# Patient Record
Sex: Female | Born: 1985 | Race: White | Hispanic: No | Marital: Married | Smoking: Never smoker
Health system: Southern US, Community
[De-identification: ages and names within clinical notes are randomized; demographics above are authoritative.]

## PROBLEM LIST (undated history)

## (undated) DIAGNOSIS — T4145XA Adverse effect of unspecified anesthetic, initial encounter: Secondary | ICD-10-CM

## (undated) DIAGNOSIS — D649 Anemia, unspecified: Secondary | ICD-10-CM

## (undated) DIAGNOSIS — K219 Gastro-esophageal reflux disease without esophagitis: Secondary | ICD-10-CM

## (undated) DIAGNOSIS — I10 Essential (primary) hypertension: Secondary | ICD-10-CM

## (undated) DIAGNOSIS — J301 Allergic rhinitis due to pollen: Secondary | ICD-10-CM

## (undated) DIAGNOSIS — K589 Irritable bowel syndrome without diarrhea: Secondary | ICD-10-CM

## (undated) DIAGNOSIS — T8859XA Other complications of anesthesia, initial encounter: Secondary | ICD-10-CM

## (undated) DIAGNOSIS — R112 Nausea with vomiting, unspecified: Secondary | ICD-10-CM

## (undated) DIAGNOSIS — Z9889 Other specified postprocedural states: Secondary | ICD-10-CM

## (undated) DIAGNOSIS — Z8619 Personal history of other infectious and parasitic diseases: Secondary | ICD-10-CM

## (undated) DIAGNOSIS — Z8489 Family history of other specified conditions: Secondary | ICD-10-CM

## (undated) HISTORY — DX: Anemia, unspecified: D64.9

## (undated) HISTORY — DX: Allergic rhinitis due to pollen: J30.1

## (undated) HISTORY — DX: Irritable bowel syndrome, unspecified: K58.9

## (undated) HISTORY — DX: Personal history of other infectious and parasitic diseases: Z86.19

## (undated) HISTORY — DX: Essential (primary) hypertension: I10

---
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
## 2006-01-20 ENCOUNTER — Encounter: Admission: RE | Admit: 2006-01-20 | Discharge: 2006-01-20 | Payer: Self-pay | Admitting: Gastroenterology

## 2006-10-04 ENCOUNTER — Ambulatory Visit (HOSPITAL_COMMUNITY): Admission: RE | Admit: 2006-10-04 | Discharge: 2006-10-04 | Payer: Self-pay | Admitting: Gastroenterology

## 2006-10-13 ENCOUNTER — Encounter: Admission: RE | Admit: 2006-10-13 | Discharge: 2006-10-13 | Payer: Self-pay | Admitting: Gastroenterology

## 2006-10-17 HISTORY — PX: CHOLECYSTECTOMY: SHX55

## 2006-11-27 ENCOUNTER — Encounter (INDEPENDENT_AMBULATORY_CARE_PROVIDER_SITE_OTHER): Payer: Self-pay | Admitting: *Deleted

## 2006-11-27 ENCOUNTER — Ambulatory Visit (HOSPITAL_COMMUNITY): Admission: RE | Admit: 2006-11-27 | Discharge: 2006-11-27 | Payer: Self-pay | Admitting: Surgery

## 2009-06-26 ENCOUNTER — Encounter: Admission: RE | Admit: 2009-06-26 | Discharge: 2009-06-26 | Payer: Self-pay | Admitting: Family Medicine

## 2010-11-08 ENCOUNTER — Encounter: Payer: Self-pay | Admitting: Family Medicine

## 2011-03-04 NOTE — Op Note (Signed)
Natasha Fernandez, Natasha Fernandez               ACCOUNT NO.:  [id number]   MEDICAL RECORD NO.:  [id number]          PATIENT TYPE:  AMB   LOCATION:  DAY                          FACILITY:  [facility]   PHYSICIAN:  Thornton Park. Daphine Deutscher, MD  DATE OF BIRTH:  April 04, 1986   DATE OF PROCEDURE:  11/27/2006  DATE OF DISCHARGE:                               OPERATIVE REPORT   PREOPERATIVE DIAGNOSIS:  Biliary dyskinesia.   POSTOPERATIVE DIAGNOSIS:  Biliary dyskinesia, normal intraoperative  cholangiogram.   PROCEDURE:  Laparoscopic cholecystectomy and intraoperative  cholangiogram.   SURGEON:  Thornton Park. Daphine Deutscher, MD   ASSISTANT:  Baruch Merl, M.D.   ANESTHESIA:  General endotracheal.   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   She was taken to room 11, given general anesthesia.  The abdomen was  prepped with Techni-Care and draped sterilely.  A longitudinal incision  was made down into her umbilicus through that I inserted a Hasson  cannula and insufflated the abdomen.  Three other trocars were placed in  the upper abdomen.  The gallbladder was grasped and elevated.  Calot's  triangle was dissected free of fat and identified the cystic artery and  cystic duct and held them out to give a critical view of this anatomy.  We went ahead and put clips on the cystic artery and a clip upon the  gallbladder side of the cystic duct and incised cystic duct to insert a  Reddick catheter and took a dynamic cholangiogram which showed good  filling of the small intrahepatic ducts and free flow into the duodenum.  The cystic duct was then triple clipped divided as was the cystic  artery.  The gallbladder was then removed from the gallbladder bed with  hook electrocautery without entering it.  The gallbladder bed was  dry  and the gallbladder was then placed in a bag and brought out through the  umbilicus.  The umbilical defect was repaired using the EndoClose device  to thread a 0-0 Vicryl and get better purchases in the lower half of the  small incision I made.  This was tied  down.  I inspected the gallbladder again, irrigated withdrew through the  area and withdrew all the trocars.  The port sites were injected with  Marcaine and closed with 4-0 Vicryl, Benzoin and Steri-Strips.  The  patient tolerated procedure well and was taken to recovery room in  satisfactory condition.      Thornton Park Daphine Deutscher, MD  Electronically Signed     MBM/MEDQ  D:  11/27/2006  T:  11/27/2006  Job:  [id number]   cc:   Petra Kuba, M.D.  Fax: [phone]

## 2011-03-21 ENCOUNTER — Ambulatory Visit (INDEPENDENT_AMBULATORY_CARE_PROVIDER_SITE_OTHER): Payer: 59 | Admitting: Family Medicine

## 2011-03-21 ENCOUNTER — Encounter: Payer: Self-pay | Admitting: Family Medicine

## 2011-03-21 DIAGNOSIS — Z0000 Encounter for general adult medical examination without abnormal findings: Secondary | ICD-10-CM | POA: Insufficient documentation

## 2011-03-21 LAB — BASIC METABOLIC PANEL
BUN: 10 mg/dL (ref 6–23)
CO2: 26 mEq/L (ref 19–32)
Calcium: 8.7 mg/dL (ref 8.4–10.5)
Chloride: 106 mEq/L (ref 96–112)
Creatinine, Ser: 0.7 mg/dL (ref 0.4–1.2)

## 2011-03-21 LAB — CBC WITH DIFFERENTIAL/PLATELET
Basophils Relative: 0.3 % (ref 0.0–3.0)
Eosinophils Absolute: 0.1 10*3/uL (ref 0.0–0.7)
HCT: 38.3 % (ref 36.0–46.0)
Hemoglobin: 12.9 g/dL (ref 12.0–15.0)
Lymphocytes Relative: 25.2 % (ref 12.0–46.0)
Lymphs Abs: 1.8 10*3/uL (ref 0.7–4.0)
MCHC: 33.8 g/dL (ref 30.0–36.0)
Neutro Abs: 4.6 10*3/uL (ref 1.4–7.7)
RBC: 4.25 Mil/uL (ref 3.87–5.11)

## 2011-03-21 LAB — HEPATIC FUNCTION PANEL
ALT: 19 U/L (ref 0–35)
Total Bilirubin: 0.5 mg/dL (ref 0.3–1.2)
Total Protein: 6.7 g/dL (ref 6.0–8.3)

## 2011-03-21 LAB — LIPID PANEL
Cholesterol: 148 mg/dL (ref 0–200)
Triglycerides: 77 mg/dL (ref 0.0–149.0)

## 2011-03-21 MED ORDER — SCOPOLAMINE 1 MG/3DAYS TD PT72
1.0000 | MEDICATED_PATCH | TRANSDERMAL | Status: AC
Start: 2011-03-21 — End: 2012-03-20

## 2011-03-21 NOTE — Assessment & Plan Note (Signed)
Pt's PE WNL.  Check labs.  Anticipatory guidance provided.  

## 2011-03-21 NOTE — Progress Notes (Signed)
  Subjective:    Patient ID: Natasha Fernandez, female    DOB: [birth year], 25 y.o.   MRN: [id number]  HPI New to establish care.  GYNSenaida Fernandez.  GI- [facility], colonoscopy 2007.  No previous PCP.  Desires CPE.   Review of Systems Patient reports no vision/ hearing changes, adenopathy,fever, weight change,  persistant/recurrent hoarseness , swallowing issues, chest pain, palpitations, edema, persistant/recurrent cough, hemoptysis, dyspnea (rest/exertional/paroxysmal nocturnal), gastrointestinal bleeding (melena, rectal bleeding), abdominal pain, significant heartburn, bowel changes, GU symptoms (dysuria, hematuria, incontinence), Gyn symptoms (abnormal  bleeding, pain),  syncope, focal weakness, memory loss, numbness & tingling, skin/hair/nail changes, abnormal bruising or bleeding, anxiety, or depression.     Objective:   Physical Exam  General Appearance:    Alert, cooperative, no distress, appears stated age  Head:    Normocephalic, without obvious abnormality, atraumatic  Eyes:    PERRL, conjunctiva/corneas clear, EOM's intact, fundi    benign, both eyes  Ears:    Normal TM's and external ear canals, both ears  Nose:   Nares normal, septum midline, mucosa normal, no drainage    or sinus tenderness  Throat:   Lips, mucosa, and tongue normal; teeth and gums normal  Neck:   Supple, symmetrical, trachea midline, no adenopathy;    Thyroid: no enlargement/tenderness/nodules  Back:     Symmetric, no curvature, ROM normal, no CVA tenderness  Lungs:     Clear to auscultation bilaterally, respirations unlabored  Chest Wall:    No tenderness or deformity   Heart:    Regular rate and rhythm, S1 and S2 normal, no murmur, rub   or gallop  Breast Exam:    Deferred to GYN  Abdomen:     Soft, non-tender, bowel sounds active all four quadrants,    no masses, no organomegaly  Genitalia:    Deferred to GYN     Extremities:   Extremities normal, atraumatic, no cyanosis or edema  Pulses:   2+ and  symmetric all extremities  Skin:   Skin color, texture, turgor normal, no rashes or lesions  Lymph nodes:   Cervical, supraclavicular, and axillary nodes normal  Neurologic:   CNII-XII intact, normal strength, sensation and reflexes    throughout          Assessment & Plan:

## 2011-03-21 NOTE — Patient Instructions (Signed)
Follow up in 1 year or as needed Your exam looks great! Keep up the good work on diet and exercise- i'm so proud of you! Call with any questions or concerns Have a great trip!!!

## 2011-03-24 LAB — VITAMIN D 1,25 DIHYDROXY: Vitamin D2 1, 25 (OH)2: <8 pg/mL

## 2011-03-29 ENCOUNTER — Telehealth: Payer: Self-pay | Admitting: *Deleted

## 2011-03-29 NOTE — Telephone Encounter (Signed)
Give her the option- return for lab visit for thyroid collection or wait until next year's CPE

## 2011-03-29 NOTE — Telephone Encounter (Signed)
Pt TSH is labeled needs to be collected. Should I have pt come back for re-draw or do you want to get at next visit? Please advise.

## 2011-03-29 NOTE — Telephone Encounter (Addendum)
Pt notified and will come in later this month to have test done at no charge. Pt was also notified that other labs look good.

## 2011-03-29 NOTE — Telephone Encounter (Signed)
Left message on voicemail to call the office

## 2011-04-04 ENCOUNTER — Other Ambulatory Visit: Payer: 59

## 2011-04-05 ENCOUNTER — Other Ambulatory Visit: Payer: Self-pay | Admitting: Family Medicine

## 2011-04-05 DIAGNOSIS — E039 Hypothyroidism, unspecified: Secondary | ICD-10-CM

## 2011-04-06 ENCOUNTER — Other Ambulatory Visit (INDEPENDENT_AMBULATORY_CARE_PROVIDER_SITE_OTHER): Payer: 59

## 2011-04-06 DIAGNOSIS — E039 Hypothyroidism, unspecified: Secondary | ICD-10-CM

## 2011-04-06 LAB — TSH: TSH: 2.05 u[IU]/mL (ref 0.35–5.50)

## 2011-04-06 NOTE — Progress Notes (Signed)
Labs only

## 2011-04-07 ENCOUNTER — Encounter: Payer: Self-pay | Admitting: *Deleted

## 2011-09-02 ENCOUNTER — Telehealth: Payer: Self-pay | Admitting: Family Medicine

## 2011-09-02 NOTE — Telephone Encounter (Signed)
Pt aware.

## 2011-09-02 NOTE — Telephone Encounter (Signed)
Patient has temp 106 at times - she is taking tylenol cold & sinus - temp comes back before she is to take the next pill - she wants to know if she can take ibuprofin - patient just wanted to leave a msg - didn't want to schedule appt said she just has a cold

## 2011-09-02 NOTE — Telephone Encounter (Signed)
Agree w/ recommendations for OTC meds.  If sxs don't improve by day 10-14, should schedule OV.

## 2011-09-02 NOTE — Telephone Encounter (Addendum)
Pt indicated that temp was 100.6 not 106. Pt advise to alternated between med to help break fever and to use some mucinex and delsym to help with congestion and cough. Pt c/o chills, body ache, runny nose, congestion, cough and fever. Pt advise OV Pt states that she is unable to come in due to her form of insurance which is very expensive but if symptoms do not improve with OTC med she will have to call back to come in. Pt was also advise of Saturday clinic. Please advise

## 2012-06-13 ENCOUNTER — Telehealth: Payer: Self-pay | Admitting: Family Medicine

## 2012-06-13 DIAGNOSIS — Z0000 Encounter for general adult medical examination without abnormal findings: Secondary | ICD-10-CM

## 2012-06-13 NOTE — Telephone Encounter (Signed)
Please advise labs to be drawn for this pt

## 2012-06-13 NOTE — Telephone Encounter (Signed)
Pt has requested labs be done prior to CPE advised pt to call insurance company to be sure they will pay for prior to CPE Pt has requested Iron & Vitamin D be added to testing if not normally ordered. Again I advised the patient she needed to verify insurance payment prior to draw to ensure they would pay for, pt stated on these 2-specifically she did not care it they paid or not as she is requesting them Please put in orders Pt CPE sch 10.30.13, Labs 10.24

## 2012-07-16 NOTE — Telephone Encounter (Signed)
CBC w/ diff BMP LFTs TSH Lipid panel Vit D Iron panel

## 2012-07-16 NOTE — Telephone Encounter (Signed)
Orders placed in chart

## 2012-07-16 NOTE — Telephone Encounter (Signed)
Dx physical exam (v70.0)

## 2012-07-17 NOTE — Telephone Encounter (Signed)
Noted, appt made

## 2012-08-09 ENCOUNTER — Other Ambulatory Visit: Payer: 59

## 2012-08-15 ENCOUNTER — Encounter: Payer: 59 | Admitting: Family Medicine

## 2012-08-20 ENCOUNTER — Other Ambulatory Visit (INDEPENDENT_AMBULATORY_CARE_PROVIDER_SITE_OTHER): Payer: BC Managed Care – PPO

## 2012-08-20 DIAGNOSIS — Z0000 Encounter for general adult medical examination without abnormal findings: Secondary | ICD-10-CM

## 2012-08-20 LAB — HEPATIC FUNCTION PANEL
Albumin: 4.1 g/dL (ref 3.5–5.2)
Alkaline Phosphatase: 50 U/L (ref 39–117)
Bilirubin, Direct: 0 mg/dL (ref 0.0–0.3)
Total Bilirubin: 0.5 mg/dL (ref 0.3–1.2)

## 2012-08-20 LAB — CBC WITH DIFFERENTIAL/PLATELET
Basophils Relative: 0.3 % (ref 0.0–3.0)
Eosinophils Relative: 1 % (ref 0.0–5.0)
HCT: 39.3 % (ref 36.0–46.0)
Hemoglobin: 12.8 g/dL (ref 12.0–15.0)
Lymphs Abs: 1.6 10*3/uL (ref 0.7–4.0)
Monocytes Relative: 8.9 % (ref 3.0–12.0)
Neutro Abs: 4.3 10*3/uL (ref 1.4–7.7)
RBC: 4.32 Mil/uL (ref 3.87–5.11)
WBC: 6.5 10*3/uL (ref 4.5–10.5)

## 2012-08-20 LAB — LIPID PANEL
LDL Cholesterol: 92 mg/dL (ref 0–99)
Total CHOL/HDL Ratio: 3
Triglycerides: 55 mg/dL (ref 0.0–149.0)

## 2012-08-20 LAB — BASIC METABOLIC PANEL
CO2: 26 mEq/L (ref 19–32)
Calcium: 8.9 mg/dL (ref 8.4–10.5)
Glucose, Bld: 88 mg/dL (ref 70–99)
Sodium: 138 mEq/L (ref 135–145)

## 2012-08-23 ENCOUNTER — Ambulatory Visit (INDEPENDENT_AMBULATORY_CARE_PROVIDER_SITE_OTHER): Payer: BC Managed Care – PPO | Admitting: Family Medicine

## 2012-08-23 ENCOUNTER — Encounter: Payer: Self-pay | Admitting: Family Medicine

## 2012-08-23 VITALS — BP 114/80 | HR 64 | Temp 98.6°F | Resp 16 | Ht 66.0 in | Wt 176.5 lb

## 2012-08-23 DIAGNOSIS — Z0000 Encounter for general adult medical examination without abnormal findings: Secondary | ICD-10-CM

## 2012-08-23 DIAGNOSIS — Z23 Encounter for immunization: Secondary | ICD-10-CM

## 2012-08-23 MED ORDER — TETANUS-DIPHTH-ACELL PERTUSSIS 5-2.5-18.5 LF-MCG/0.5 IM SUSP: 0.5000 mL | Freq: Once | INTRAMUSCULAR | Status: AC

## 2012-08-23 NOTE — Assessment & Plan Note (Signed)
Pt's PE WNL.  UTD on GYN.  Labs reviewed.  Anticipatory guidance provided.

## 2012-08-23 NOTE — Progress Notes (Signed)
  Subjective:    Patient ID: Natasha Fernandez, female    DOB: [birth year], 26 y.o.   MRN: [id number]  HPI CPE- UTD on pap [name]).  Wants labs today.   Review of Systems Patient reports no vision/ hearing changes,fever, weight change, persistant/recurrent hoarseness , swallowing issues, chest pain, palpitations, edema, persistant/recurrent cough, hemoptysis, dyspnea (rest/exertional/paroxysmal nocturnal), gastrointestinal bleeding (melena, rectal bleeding), abdominal pain, significant heartburn, bowel changes, GU symptoms (dysuria, hematuria, incontinence), Gyn symptoms (abnormal  bleeding, pain),  syncope, focal weakness, memory loss, numbness & tingling, skin/hair/nail changes, abnormal bruising or bleeding, anxiety, or depression.   L cervical LAD x2- not tender, not enlarging    Objective:   Physical Exam General Appearance:    Alert, cooperative, no distress, appears stated age  Head:    Normocephalic, without obvious abnormality, atraumatic  Eyes:    PERRL, conjunctiva/corneas clear, EOM's intact, fundi    benign, both eyes  Ears:    Normal TM's and external ear canals, both ears  Nose:   Nares normal, septum midline, mucosa normal, no drainage    or sinus tenderness  Throat:   Lips, mucosa, and tongue normal; teeth and gums normal  Neck:   Supple, symmetrical, trachea midline, no adenopathy;    Thyroid: no enlargement/tenderness/nodules  Back:     Symmetric, no curvature, ROM normal, no CVA tenderness  Lungs:     Clear to auscultation bilaterally, respirations unlabored  Chest Wall:    No tenderness or deformity   Heart:    Regular rate and rhythm, S1 and S2 normal, no murmur, rub   or gallop  Breast Exam:    Deferred to GYN  Abdomen:     Soft, non-tender, bowel sounds active all four quadrants,    no masses, no organomegaly  Genitalia:    Deferred to GYN  Rectal:    Extremities:   Extremities normal, atraumatic, no cyanosis or edema  Pulses:   2+ and symmetric all  extremities  Skin:   Skin color, texture, turgor normal, no rashes or lesions  Lymph nodes:   supraclavicular axillary nodes normal.  <1 cm shotty L cervical LAD, no TTP, freely mobile  Neurologic:   CNII-XII intact, normal strength, sensation and reflexes    throughout          Assessment & Plan:

## 2012-08-23 NOTE — Patient Instructions (Addendum)
Follow up in 1 year or as needed We'll notify you of your lab results and make any changes if needed Keep up the good work!  You look great! Call with any questions or concerns Good luck w/ baby!!!

## 2012-08-23 NOTE — Addendum Note (Signed)
Addended by: Jackson Latino on: 08/23/2012 08:35 AM   Modules accepted: Orders

## 2012-08-25 LAB — VITAMIN D 1,25 DIHYDROXY: Vitamin D2 1, 25 (OH)2: <8 pg/mL

## 2012-12-01 ENCOUNTER — Other Ambulatory Visit: Payer: Self-pay

## 2013-02-27 ENCOUNTER — Telehealth: Payer: Self-pay | Admitting: Family Medicine

## 2013-02-27 NOTE — Telephone Encounter (Signed)
Patient Information:  Caller Name: Cydnie  Phone: [phone]  Patient: Natasha Fernandez  Gender: Female  DOB: 12-14-1985  Age: 27 Years  PCP: Sheliah Hatch  Pregnant: No  Office Follow Up:  Does the office need to follow up with this patient?: No  Instructions For The Office: N/A   Symptoms  Reason For Call & Symptoms: Pt is having episodes of dizziness BP 101/78.  Reviewed Health History In EMR: Yes  Reviewed Medications In EMR: Yes  Reviewed Allergies In EMR: Yes  Reviewed Surgeries / Procedures: Yes  Date of Onset of Symptoms: 02/12/2013 OB / GYN:  LMP: 02/12/2013  Guideline(s) Used:  Dizziness  Disposition Per Guideline:   Home Care  Reason For Disposition Reached:   Sudden or prolonged standing probably causing dizziness  Advice Given:  Temporary Dizziness  is usually a harmless symptom. It can be caused by not drinking enough water during sports or hot weather. It can also be caused by skipping a meal, too much sun exposure, standing up suddenly, standing too long in one place or even a viral illness.  Patient Will Follow Care Advice:  YES

## 2013-02-27 NOTE — Telephone Encounter (Signed)
Increase water intake and make sure she is eating regularly.  If symptoms continue will need OV

## 2013-02-28 NOTE — Telephone Encounter (Signed)
Left message to call office

## 2013-02-28 NOTE — Telephone Encounter (Signed)
Discuss with patient  

## 2013-03-25 ENCOUNTER — Telehealth: Payer: Self-pay | Admitting: Family Medicine

## 2013-03-25 DIAGNOSIS — R42 Dizziness and giddiness: Secondary | ICD-10-CM

## 2013-03-25 NOTE — Telephone Encounter (Signed)
Please advise 

## 2013-03-25 NOTE — Telephone Encounter (Signed)
Pt scheduled  

## 2013-03-25 NOTE — Telephone Encounter (Signed)
Patient called stating she is still having dizziness as discussed in 5/14. She knows she needs OV, but would like to have lab work done prior to this appt. Can we do this for her? If so, please place orders and route back to me. I will call pt back to schedule.

## 2013-03-25 NOTE — Telephone Encounter (Signed)
Will need CBC w/ diff, BMP, TSH (dx- dizziness)

## 2013-03-25 NOTE — Telephone Encounter (Signed)
Orders placed. Please schedule pt.

## 2013-03-26 ENCOUNTER — Other Ambulatory Visit (INDEPENDENT_AMBULATORY_CARE_PROVIDER_SITE_OTHER): Payer: BC Managed Care – PPO

## 2013-03-26 DIAGNOSIS — R42 Dizziness and giddiness: Secondary | ICD-10-CM

## 2013-03-26 LAB — BASIC METABOLIC PANEL
Calcium: 9 mg/dL (ref 8.4–10.5)
Chloride: 105 mEq/L (ref 96–112)
Creatinine, Ser: 0.7 mg/dL (ref 0.4–1.2)
Sodium: 138 mEq/L (ref 135–145)

## 2013-03-26 LAB — CBC WITH DIFFERENTIAL/PLATELET
Basophils Absolute: 0 10*3/uL (ref 0.0–0.1)
Eosinophils Absolute: 0.1 10*3/uL (ref 0.0–0.7)
Hemoglobin: 12.5 g/dL (ref 12.0–15.0)
Lymphocytes Relative: 27.3 % (ref 12.0–46.0)
Lymphs Abs: 1.5 10*3/uL (ref 0.7–4.0)
MCHC: 33.5 g/dL (ref 30.0–36.0)
Neutro Abs: 3.5 10*3/uL (ref 1.4–7.7)
Platelets: 285 10*3/uL (ref 150.0–400.0)
RDW: 12.6 % (ref 11.5–14.6)

## 2013-03-26 LAB — TSH: TSH: 1.62 u[IU]/mL (ref 0.35–5.50)

## 2013-03-28 ENCOUNTER — Ambulatory Visit: Payer: BC Managed Care – PPO | Admitting: Family Medicine

## 2013-03-29 ENCOUNTER — Ambulatory Visit: Payer: BC Managed Care – PPO | Admitting: Family Medicine

## 2013-05-07 ENCOUNTER — Ambulatory Visit: Payer: BC Managed Care – PPO | Admitting: Family Medicine

## 2013-05-08 ENCOUNTER — Encounter: Payer: Self-pay | Admitting: Family Medicine

## 2013-07-10 ENCOUNTER — Telehealth: Payer: Self-pay | Admitting: General Practice

## 2013-07-10 NOTE — Telephone Encounter (Signed)
Pt called stating that she had received the flu shot a few years ago stated that she had an anaphylactic reaction. Pt would like to receive another flu shot. Would like to know if it would be ok if she gets a preservative free injection?

## 2013-07-11 NOTE — Telephone Encounter (Signed)
Pt advised that the risks of receiving the injection far out weigh the preventative measures. Pt notified that if she needed a letter for work stating she should be exempt for the shots.

## 2013-07-11 NOTE — Telephone Encounter (Signed)
Based on the fact it was an anaphylactic reaction, she should NOT get another vaccine.  It was unlikely to be the preservative and the risk is just too great.

## 2013-08-22 ENCOUNTER — Other Ambulatory Visit: Payer: Self-pay

## 2013-10-30 ENCOUNTER — Telehealth: Payer: Self-pay | Admitting: *Deleted

## 2013-10-30 MED ORDER — OSELTAMIVIR PHOSPHATE 75 MG PO CAPS: 75.0000 mg | ORAL_CAPSULE | Freq: Every day | ORAL | Status: DC

## 2013-10-30 NOTE — Telephone Encounter (Signed)
Patient called and stated that she would exposed to the flu at work and would like a prescription for Tamiflu. Please advise. JG//CMA

## 2013-10-30 NOTE — Telephone Encounter (Signed)
Med filled and pt notified.  

## 2013-10-30 NOTE — Telephone Encounter (Signed)
Ok for Tamiflu 75mg 1 tab daily x10 days 

## 2015-01-07 LAB — HM PAP SMEAR

## 2016-02-27 ENCOUNTER — Encounter (HOSPITAL_BASED_OUTPATIENT_CLINIC_OR_DEPARTMENT_OTHER): Payer: Self-pay | Admitting: *Deleted

## 2016-02-27 ENCOUNTER — Emergency Department (HOSPITAL_BASED_OUTPATIENT_CLINIC_OR_DEPARTMENT_OTHER)
Admission: EM | Admit: 2016-02-27 | Discharge: 2016-02-27 | Disposition: A | Discharge status: Home / Self Care | Payer: 59 | Attending: Emergency Medicine | Admitting: Emergency Medicine

## 2016-02-27 DIAGNOSIS — I10 Essential (primary) hypertension: Secondary | ICD-10-CM | POA: Diagnosis not present

## 2016-02-27 DIAGNOSIS — M549 Dorsalgia, unspecified: Secondary | ICD-10-CM | POA: Insufficient documentation

## 2016-02-27 DIAGNOSIS — Y9389 Activity, other specified: Secondary | ICD-10-CM | POA: Insufficient documentation

## 2016-02-27 DIAGNOSIS — Y92410 Unspecified street and highway as the place of occurrence of the external cause: Secondary | ICD-10-CM | POA: Diagnosis not present

## 2016-02-27 DIAGNOSIS — Y999 Unspecified external cause status: Secondary | ICD-10-CM | POA: Diagnosis not present

## 2016-02-27 MED ORDER — CYCLOBENZAPRINE HCL 10 MG PO TABS: 10.0000 mg | ORAL_TABLET | Freq: Three times a day (TID) | ORAL | Status: DC | PRN
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
## 2016-02-27 NOTE — Discharge Instructions (Signed)

## 2016-12-08 DIAGNOSIS — R1031 Right lower quadrant pain: Secondary | ICD-10-CM | POA: Diagnosis not present

## 2016-12-13 DIAGNOSIS — N926 Irregular menstruation, unspecified: Secondary | ICD-10-CM | POA: Diagnosis not present

## 2016-12-13 DIAGNOSIS — Z0000 Encounter for general adult medical examination without abnormal findings: Secondary | ICD-10-CM | POA: Diagnosis not present

## 2016-12-29 ENCOUNTER — Ambulatory Visit: Payer: 59 | Admitting: Family Medicine

## 2017-01-05 LAB — BASIC METABOLIC PANEL
BUN: 12 mg/dL (ref 4–21)
CREATININE: 0.9 mg/dL (ref 0.5–1.1)
Glucose: 93 mg/dL
Potassium: 4.4 mmol/L (ref 3.4–5.3)
Sodium: 138 mmol/L (ref 137–147)

## 2017-01-05 LAB — LIPID PANEL
Cholesterol: 177 mg/dL (ref 0–200)
HDL: 52 mg/dL (ref 35–70)
LDL CALC: 108 mg/dL
LDl/HDL Ratio: 2.1
Triglycerides: 84 mg/dL (ref 40–160)

## 2017-01-05 LAB — TSH: TSH: 2.34 u[IU]/mL (ref <=5.90)

## 2017-01-05 LAB — CBC AND DIFFERENTIAL
HCT: 38 % (ref 36–46)
Hemoglobin: 13.3 g/dL (ref 12.0–16.0)
Neutrophils Absolute: 4 /uL
Platelets: 380 10*3/uL (ref 150–399)
WBC: 5.8 10*3/mL

## 2017-01-05 LAB — HEPATIC FUNCTION PANEL
ALT: 24 U/L (ref 7–35)
AST: 17 U/L (ref 13–35)
Alkaline Phosphatase: 71 U/L (ref 25–125)
BILIRUBIN, TOTAL: 0.2 mg/dL

## 2017-01-05 LAB — VITAMIN D 25 HYDROXY (VIT D DEFICIENCY, FRACTURES): VIT D 25 HYDROXY: 19.8

## 2017-01-06 ENCOUNTER — Ambulatory Visit (INDEPENDENT_AMBULATORY_CARE_PROVIDER_SITE_OTHER): Payer: 59 | Admitting: Family Medicine

## 2017-01-06 ENCOUNTER — Encounter: Payer: Self-pay | Admitting: Family Medicine

## 2017-01-06 ENCOUNTER — Other Ambulatory Visit: Payer: Self-pay | Admitting: Family Medicine

## 2017-01-06 VITALS — BP 120/80 | HR 74 | Temp 98.4°F | Resp 16 | Ht 65.0 in | Wt 200.4 lb

## 2017-01-06 DIAGNOSIS — E010 Iodine-deficiency related diffuse (endemic) goiter: Secondary | ICD-10-CM | POA: Diagnosis not present

## 2017-01-06 DIAGNOSIS — N6310 Unspecified lump in the right breast, unspecified quadrant: Secondary | ICD-10-CM

## 2017-01-06 DIAGNOSIS — R1310 Dysphagia, unspecified: Secondary | ICD-10-CM

## 2017-01-06 DIAGNOSIS — R413 Other amnesia: Secondary | ICD-10-CM | POA: Diagnosis not present

## 2017-01-06 DIAGNOSIS — R591 Generalized enlarged lymph nodes: Secondary | ICD-10-CM

## 2017-01-06 LAB — CBC WITH DIFFERENTIAL/PLATELET
BASOS ABS: 0 10*3/uL (ref 0.0–0.1)
BASOS PCT: 0.4 % (ref 0.0–3.0)
EOS ABS: 0.1 10*3/uL (ref 0.0–0.7)
Eosinophils Relative: 1.2 % (ref 0.0–5.0)
HCT: 37.9 % (ref 36.0–46.0)
Hemoglobin: 12.4 g/dL (ref 12.0–15.0)
Lymphocytes Relative: 20.4 % (ref 12.0–46.0)
Lymphs Abs: 1.7 10*3/uL (ref 0.7–4.0)
MCHC: 32.6 g/dL (ref 30.0–36.0)
MCV: 89.6 fl (ref 78.0–100.0)
MONO ABS: 1 10*3/uL (ref 0.1–1.0)
Monocytes Relative: 11.2 % (ref 3.0–12.0)
NEUTROS ABS: 5.7 10*3/uL (ref 1.4–7.7)
Neutrophils Relative %: 66.8 % (ref 43.0–77.0)
PLATELETS: 369 10*3/uL (ref 150.0–400.0)
RBC: 4.24 Mil/uL (ref 3.87–5.11)
RDW: 13.2 % (ref 11.5–15.5)
WBC: 8.5 10*3/uL (ref 4.0–10.5)

## 2017-01-06 LAB — SEDIMENTATION RATE: SED RATE: 7 mm/h (ref 0–20)

## 2017-01-06 NOTE — Progress Notes (Signed)
Pre visit review using our clinic review tool, if applicable. No additional management support is needed unless otherwise documented below in the visit note. 

## 2017-01-06 NOTE — Patient Instructions (Signed)
We'll notify you of your lab results and the imaging and determine the next step We'll call you with your thyroid/neck ultrasound and your mammogram Monitor the memory symptoms- this may be stress related b/c you're worried about the lymph node situation Call with any questions or concerns Hang in there!  We'll figure this out!!!

## 2017-01-06 NOTE — Progress Notes (Signed)
   Subjective:    Patient ID: Natasha Fernandez, female    DOB: 15-Apr-1986, 31 y.o.   MRN: [id number]  HPI Swollen lymph nodes- sxs started 'a couple of months' ago.  No relation to illness.  No fevers.  2 episodes of night sweats but this improved when pt awoke and turned on fan.  No unexplained weight loss.  Cervical, behind L ear, clavicular, R occipital, axillary.  No inguinal nodes.  Nodes are not tender.  Pt does report dental implant 3 months ago  Brain fog- sxs started 2-3 weeks ago.  sxs are intermittent.  Pt reports sleeping well, 8-9 hrs/night.  Denies increased stress- left very stressful job 8 months.  Having difficulty w/ word finding and memory.  Grandmother had Alzheimer's.  Labs ~1 month ago were WNL w/ exception of mildly low Vit D.  Currently on supplement.  Dysphagia- pt reports difficulty w/ swallowing solids.  No difficulty w/ liquids.  Was having choking episodes more frequently and now is much more aware and has to 'be careful'.     Review of Systems For ROS see HPI     Objective:   Physical Exam  Constitutional: She is oriented to person, place, and time. She appears well-developed and well-nourished. No distress.  overweight  HENT:  Head: Normocephalic and atraumatic.  Nose: Nose normal.  Mouth/Throat: Oropharynx is clear and moist. No oropharyngeal exudate.  Neck: Normal range of motion. Neck supple. Thyromegaly (diffuse enlargement w/o nodularity) present.  Pulmonary/Chest: Effort normal and breath sounds normal. She exhibits no tenderness.  ~1 cm mobile soft tissue mass at edge of R breast at 11 o'clock- no TTP  Lymphadenopathy:    She has cervical adenopathy (shotty submandibular, posterior auricular, proximal supraclavicular, axillary, inguinal LAD).  Neurological: She is alert and oriented to person, place, and time.  Skin: Skin is warm and dry. No rash noted. No erythema.  Psychiatric: She has a normal mood and affect. Her behavior is normal. Thought  content normal.  Vitals reviewed.         Assessment & Plan:  Diffuse Lymphadenopathy- pt has shotty LAD of head, neck, axilla, inguinal area.  None are TTP.  Pt had recent CBC w/ diff ~1 month ago that was WNL.  She denies recent illness.  No fevers, chills, cough.  Repeat CBC, get ESR.  Will get [place] of neck.  If no obvious explanation- will refer to hematology.  Pt expressed understanding and is in agreement w/ plan.   Dysphagia- new.  May be related to thyromegaly.  Get neck US to assess.  If no improvement, will need GI/ENT referral.  Memory changes- suspect that this is stress related.  Pt to monitor sxs and note any changes or worsening  R breast mass- w/ associated shotty R axillary LAD.  Get diagnostic mammo  Thyromegaly- get [place] to assess

## 2017-01-11 ENCOUNTER — Ambulatory Visit
Admission: RE | Admit: 2017-01-11 | Discharge: 2017-01-11 | Disposition: A | Discharge status: Home / Self Care | Payer: 59 | Source: Ambulatory Visit | Attending: Family Medicine | Admitting: Family Medicine

## 2017-01-11 DIAGNOSIS — R928 Other abnormal and inconclusive findings on diagnostic imaging of breast: Secondary | ICD-10-CM | POA: Diagnosis not present

## 2017-01-11 DIAGNOSIS — N6310 Unspecified lump in the right breast, unspecified quadrant: Secondary | ICD-10-CM

## 2017-01-11 DIAGNOSIS — N6489 Other specified disorders of breast: Secondary | ICD-10-CM | POA: Diagnosis not present

## 2017-01-17 ENCOUNTER — Ambulatory Visit
Admission: RE | Admit: 2017-01-17 | Discharge: 2017-01-17 | Disposition: A | Discharge status: Home / Self Care | Payer: 59 | Source: Ambulatory Visit | Attending: Family Medicine | Admitting: Family Medicine

## 2017-01-17 DIAGNOSIS — R591 Generalized enlarged lymph nodes: Secondary | ICD-10-CM

## 2017-01-17 DIAGNOSIS — E042 Nontoxic multinodular goiter: Secondary | ICD-10-CM | POA: Diagnosis not present

## 2017-01-17 DIAGNOSIS — E010 Iodine-deficiency related diffuse (endemic) goiter: Secondary | ICD-10-CM

## 2017-01-18 ENCOUNTER — Other Ambulatory Visit: Payer: Self-pay | Admitting: Physician Assistant

## 2017-01-18 ENCOUNTER — Ambulatory Visit
Admission: RE | Admit: 2017-01-18 | Discharge: 2017-01-18 | Disposition: A | Discharge status: Home / Self Care | Payer: 59 | Source: Ambulatory Visit | Attending: Physician Assistant | Admitting: Physician Assistant

## 2017-01-18 ENCOUNTER — Other Ambulatory Visit (HOSPITAL_COMMUNITY)
Admission: RE | Admit: 2017-01-18 | Discharge: 2017-01-18 | Disposition: A | Discharge status: Home / Self Care | Payer: 59 | Source: Ambulatory Visit | Attending: General Surgery | Admitting: General Surgery

## 2017-01-18 ENCOUNTER — Other Ambulatory Visit: Payer: Self-pay | Admitting: Family Medicine

## 2017-01-18 ENCOUNTER — Inpatient Hospital Stay: Admission: RE | Admit: 2017-01-18 | Payer: 59 | Source: Ambulatory Visit

## 2017-01-18 DIAGNOSIS — E041 Nontoxic single thyroid nodule: Secondary | ICD-10-CM | POA: Insufficient documentation

## 2017-01-18 DIAGNOSIS — C73 Malignant neoplasm of thyroid gland: Secondary | ICD-10-CM | POA: Diagnosis not present

## 2017-01-18 DIAGNOSIS — E042 Nontoxic multinodular goiter: Secondary | ICD-10-CM

## 2017-01-20 ENCOUNTER — Other Ambulatory Visit: Payer: Self-pay | Admitting: Physician Assistant

## 2017-01-20 DIAGNOSIS — K219 Gastro-esophageal reflux disease without esophagitis: Secondary | ICD-10-CM | POA: Insufficient documentation

## 2017-01-20 DIAGNOSIS — C73 Malignant neoplasm of thyroid gland: Secondary | ICD-10-CM

## 2017-01-23 ENCOUNTER — Ambulatory Visit: Payer: Self-pay | Admitting: Otolaryngology

## 2017-01-23 NOTE — H&P (Signed)
  HPI: She was having difficulty with fullness in her throat and tightness in her throat, occasional trouble swallowing. She was also having increasing frequency of heartburn. She drinks about 4 or 5 servings of diet cola daily. He ended up having a thyroid examination that revealed a nodule. She had an ultrasound and then a biopsy which revealed papillary thyroid cancer. Otherwise in very good health.  PMH/Meds/All/SocHx/FamHx/ROS:   Past Medical History:  Diagnosis Date  . IBS (irritable bowel syndrome)   Past Surgical History:  Procedure Laterality Date  . CHOLECYSTECTOMY  . MOUTH SURGERY   No family history of bleeding disorders, wound healing problems or difficulty with anesthesia.   Social History   Social History  . Marital status: N/A  Spouse name: N/A  . Number of children: N/A  . Years of education: N/A   Occupational History  . Not on file.   Social History Main Topics  . Smoking status: Never Smoker  . Smokeless tobacco: Never Used  . Alcohol use Not on file  . Drug use: Unknown  . Sexual activity: Not on file   Other Topics Concern  . Not on file   Social History Narrative  . No narrative on file   No current outpatient prescriptions on file.  A complete ROS was performed with pertinent positives/negatives noted in the HPI. The remainder of the ROS are negative.   Physical Exam:   Ht 1.727 m (5\' 8" )  Wt 86.2 kg (190 lb)  BMI 28.89 kg/m   General: Healthy and alert, in no distress, breathing easily. Normal affect. In a pleasant mood. Head: Normocephalic, atraumatic. No masses, or scars. Eyes: Pupils are equal, and reactive to light. Vision is grossly intact. No spontaneous or gaze nystagmus. Ears: Ear canals are clear. Tympanic membranes are intact, with normal landmarks and the middle ears are clear and healthy. Hearing: Grossly normal. Nose: Nasal cavities are clear with healthy mucosa, no polyps or exudate.Airways are patent. Face: No masses or  scars, facial nerve function is symmetric. Oral Cavity: No mucosal abnormalities are noted. Tongue with normal mobility. Dentition appears healthy. Oropharynx: Tonsils are symmetric. There are no mucosal masses identified. Tongue base appears normal and healthy. Larynx/Hypopharynx: indirect exam reveals healthy, mobile vocal cords, without mucosal lesions in the hypopharynx or larynx. Chest: Deferred Neck: No palpable masses, no cervical adenopathy, there is a palpable thyroid nodule on the right, approximately 2 cm. Neuro: Cranial nerves II-XII will normal function. Balance: Normal gate. Other findings: none.  Independent Review of Additional Tests or Records:  none  Procedures:  none  Impression & Plans:  Papillary thyroid cancer, biopsy-proven. Recommend total thyroidectomy. Risks and benefits were discussed including hypocalcemia, recurrent nerve injury, bleeding, infection, anesthetic complications.  Her actual symptoms were probably reflux related.We discussed causes of reflux, including lifestyle and dietary factors. Recommend strict avoidance of all tobacco, caffeine, alcohol, chocolate and peppermint. A reflux handout with more detailed instructions was provided to the patient.

## 2017-02-01 ENCOUNTER — Ambulatory Visit (HOSPITAL_COMMUNITY)
Admission: RE | Admit: 2017-02-01 | Discharge: 2017-02-01 | Disposition: A | Discharge status: Home / Self Care | Payer: 59 | Source: Ambulatory Visit | Attending: Anesthesiology | Admitting: Anesthesiology

## 2017-02-01 ENCOUNTER — Encounter (HOSPITAL_COMMUNITY)
Admission: RE | Admit: 2017-02-01 | Discharge: 2017-02-01 | Disposition: A | Discharge status: Home / Self Care | Payer: 59 | Source: Ambulatory Visit | Attending: Otolaryngology | Admitting: Otolaryngology

## 2017-02-01 ENCOUNTER — Encounter (HOSPITAL_COMMUNITY): Payer: Self-pay

## 2017-02-01 DIAGNOSIS — Z01812 Encounter for preprocedural laboratory examination: Secondary | ICD-10-CM | POA: Diagnosis not present

## 2017-02-01 DIAGNOSIS — Z01810 Encounter for preprocedural cardiovascular examination: Secondary | ICD-10-CM | POA: Diagnosis not present

## 2017-02-01 DIAGNOSIS — E890 Postprocedural hypothyroidism: Secondary | ICD-10-CM | POA: Insufficient documentation

## 2017-02-01 DIAGNOSIS — Z01818 Encounter for other preprocedural examination: Secondary | ICD-10-CM

## 2017-02-01 HISTORY — DX: Gastro-esophageal reflux disease without esophagitis: K21.9

## 2017-02-01 HISTORY — DX: Adverse effect of unspecified anesthetic, initial encounter: T41.45XA

## 2017-02-01 HISTORY — DX: Nausea with vomiting, unspecified: R11.2

## 2017-02-01 HISTORY — DX: Family history of other specified conditions: Z84.89

## 2017-02-01 HISTORY — DX: Other complications of anesthesia, initial encounter: T88.59XA

## 2017-02-01 HISTORY — DX: Other specified postprocedural states: Z98.890

## 2017-02-01 LAB — CBC
HEMATOCRIT: 37.4 % (ref 36.0–46.0)
HEMOGLOBIN: 12.4 g/dL (ref 12.0–15.0)
MCH: 29.2 pg (ref 26.0–34.0)
MCHC: 33.2 g/dL (ref 30.0–36.0)
MCV: 88.2 fL (ref 78.0–100.0)
Platelets: 318 10*3/uL (ref 150–400)
RBC: 4.24 MIL/uL (ref 3.87–5.11)
RDW: 12.6 % (ref 11.5–15.5)
WBC: 9.3 10*3/uL (ref 4.0–10.5)

## 2017-02-01 LAB — HCG, SERUM, QUALITATIVE: Preg, Serum: NEGATIVE

## 2017-02-01 NOTE — Pre-Procedure Instructions (Addendum)
Corazon Nickolas Gallus  02/01/2017      [facility] [place], [place] - [place] AT [address] [place] [address] Phone: [phone] Fax: [phone]  [facility] [address] - [place], [place] [address] [place] [address] Phone: [phone] Fax: [phone]    Your procedure is scheduled on 02/08/2017  Report to [facility] Admitting at 7:00 A.M.  Call this number if you have problems the morning of surgery:  [phone]   Remember:  Do not eat food or drink liquids after midnight.  On Tuesday night   Take these medicines the morning of surgery with A SIP OF WATER :  NONE  STOP all herbel meds, nsaids (aleve,naproxen,advil,ibuprofen) Starting today including all vitamins/supplements,aspirin   Do not wear jewelry, make-up or nail polish.   Do not wear lotions, powders, or perfumes, or deoderant.   Do not shave 48 hours prior to surgery.     Do not bring valuables to the hospital.   [facility] is not responsible for any belongings or valuables.  Contacts, dentures or bridgework may not be worn into surgery.  Leave your suitcase in the car.  After surgery it may be brought to your room.  For patients admitted to the hospital, discharge time will be determined by your treatment team.  Patients discharged the day of surgery will not be allowed to drive home.   -  Special instructions:  Special Instructions: [facility] - Preparing for Surgery  Before surgery, you can play an important role.  Because skin is not sterile, your skin needs to be as free of germs as possible.  You can reduce the number of germs on you skin by washing with CHG (chlorahexidine gluconate) soap before surgery.  CHG is an antiseptic cleaner which kills germs and bonds with the skin to continue killing germs even after washing.  Please DO NOT use if you have an allergy to CHG or  antibacterial soaps.  If your skin becomes reddened/irritated stop using the CHG and inform your nurse when you arrive at Short Stay.  Do not shave (including legs and underarms) for at least 48 hours prior to the first CHG shower.  You may shave your face.  Please follow these instructions carefully:   1.  Shower with CHG Soap the night before surgery and the  morning of Surgery.  2.  If you choose to wash your hair, wash your hair first as usual with your  normal shampoo.  3.  After you shampoo, rinse your hair and body thoroughly to remove the  Shampoo.  4.  Use CHG as you would any other liquid soap.  You can apply chg directly to the skin and wash gently with scrungie or a clean washcloth.  5.  Apply the CHG Soap to your body ONLY FROM THE NECK DOWN.    Do not use on open wounds or open sores.  Avoid contact with your eyes, ears, mouth and genitals (private parts).  Wash genitals (private parts)   with your normal soap.  6.  Wash thoroughly, paying special attention to the area where your surgery will be performed.  7.  Thoroughly rinse your body with warm water from the neck down.  8.  DO NOT shower/wash with your normal soap after using and rinsing off   the CHG Soap.  9.  Pat yourself dry with  a clean towel.            10.  Wear clean pajamas.            11.  Place clean sheets on your bed the night of your first shower and do not sleep with pets.  Day of Surgery  Do not apply any lotions/deodorants the morning of surgery.  Please wear clean clothes to the hospital/surgery center.  Please read over the  fact sheets that you were given.

## 2017-02-08 ENCOUNTER — Encounter (HOSPITAL_COMMUNITY): Payer: Self-pay | Admitting: Urology

## 2017-02-08 ENCOUNTER — Observation Stay (HOSPITAL_COMMUNITY)
Admission: RE | Admit: 2017-02-08 | Discharge: 2017-02-09 | Disposition: A | Discharge status: Home / Self Care | Payer: 59 | Source: Ambulatory Visit | Attending: Otolaryngology | Admitting: Otolaryngology

## 2017-02-08 ENCOUNTER — Encounter (HOSPITAL_COMMUNITY)
Admission: RE | Disposition: A | Discharge status: Home / Self Care | Payer: Self-pay | Source: Ambulatory Visit | Attending: Otolaryngology

## 2017-02-08 ENCOUNTER — Ambulatory Visit (HOSPITAL_COMMUNITY): Payer: 59 | Admitting: Anesthesiology

## 2017-02-08 DIAGNOSIS — I10 Essential (primary) hypertension: Secondary | ICD-10-CM | POA: Diagnosis not present

## 2017-02-08 DIAGNOSIS — C73 Malignant neoplasm of thyroid gland: Principal | ICD-10-CM | POA: Insufficient documentation

## 2017-02-08 DIAGNOSIS — Z79899 Other long term (current) drug therapy: Secondary | ICD-10-CM | POA: Insufficient documentation

## 2017-02-08 DIAGNOSIS — K219 Gastro-esophageal reflux disease without esophagitis: Secondary | ICD-10-CM | POA: Diagnosis not present

## 2017-02-08 DIAGNOSIS — Z85850 Personal history of malignant neoplasm of thyroid: Secondary | ICD-10-CM | POA: Diagnosis present

## 2017-02-08 HISTORY — PX: THYROIDECTOMY: SHX17

## 2017-02-08 LAB — CALCIUM
Calcium: 8.4 mg/dL — ABNORMAL LOW (ref 8.9–10.3)
Calcium: 8.3 mg/dL — ABNORMAL LOW (ref 8.9–10.3)

## 2017-02-08 SURGERY — THYROIDECTOMY
Anesthesia: General | Site: Neck

## 2017-02-08 MED ORDER — ACETAMINOPHEN 160 MG/5ML PO SOLN
960.0000 mg | Freq: Once | ORAL | Status: AC
  Administered 2017-02-08: 960 mg via ORAL
  Filled 2017-02-08: qty 40.6

## 2017-02-08 MED ORDER — PROPOFOL 10 MG/ML IV BOLUS
INTRAVENOUS | Status: DC | PRN
  Administered 2017-02-08: 180 mg via INTRAVENOUS

## 2017-02-08 MED ORDER — PROMETHAZINE HCL 25 MG PO TABS
25.0000 mg | ORAL_TABLET | Freq: Four times a day (QID) | ORAL | Status: DC | PRN
  Administered 2017-02-08 – 2017-02-09 (×3): 25 mg via ORAL
  Filled 2017-02-08 (×4): qty 1

## 2017-02-08 MED ORDER — ROCURONIUM BROMIDE 10 MG/ML (PF) SYRINGE
PREFILLED_SYRINGE | INTRAVENOUS | Status: AC
  Filled 2017-02-08: qty 5

## 2017-02-08 MED ORDER — LIDOCAINE HCL (CARDIAC) 20 MG/ML IV SOLN
INTRAVENOUS | Status: DC | PRN
  Administered 2017-02-08: 100 mg via INTRAVENOUS

## 2017-02-08 MED ORDER — FENTANYL CITRATE (PF) 100 MCG/2ML IJ SOLN
INTRAMUSCULAR | Status: DC | PRN
Start: 2017-02-08 — End: 2017-02-08
  Administered 2017-02-08: 100 ug via INTRAVENOUS
  Administered 2017-02-08 (×3): 50 ug via INTRAVENOUS

## 2017-02-08 MED ORDER — SUGAMMADEX SODIUM 200 MG/2ML IV SOLN
INTRAVENOUS | Status: AC
  Filled 2017-02-08: qty 2

## 2017-02-08 MED ORDER — KETOROLAC TROMETHAMINE 30 MG/ML IJ SOLN
INTRAMUSCULAR | Status: DC | PRN
  Administered 2017-02-08: 30 mg via INTRAVENOUS

## 2017-02-08 MED ORDER — KETOROLAC TROMETHAMINE 30 MG/ML IJ SOLN
INTRAMUSCULAR | Status: AC
  Filled 2017-02-08: qty 1

## 2017-02-08 MED ORDER — LACTATED RINGERS IV SOLN
INTRAVENOUS | Status: DC
  Administered 2017-02-08 (×2): via INTRAVENOUS

## 2017-02-08 MED ORDER — VITAMIN D3 25 MCG (1000 UNIT) PO TABS: 1000.0000 [IU] | ORAL_TABLET | ORAL | Status: DC

## 2017-02-08 MED ORDER — HYDROCODONE-ACETAMINOPHEN 7.5-325 MG PO TABS: 1.0000 | ORAL_TABLET | Freq: Four times a day (QID) | ORAL | 0 refills | Status: DC | PRN

## 2017-02-08 MED ORDER — SCOPOLAMINE 1 MG/3DAYS TD PT72
1.0000 | MEDICATED_PATCH | Freq: Once | TRANSDERMAL | Status: DC
  Administered 2017-02-08: 1.5 mg via TRANSDERMAL
  Filled 2017-02-08: qty 1

## 2017-02-08 MED ORDER — 0.9 % SODIUM CHLORIDE (POUR BTL) OPTIME
TOPICAL | Status: DC | PRN
Start: 2017-02-08 — End: 2017-02-08
  Administered 2017-02-08: 1000 mL

## 2017-02-08 MED ORDER — ARTIFICIAL TEARS OPHTHALMIC OINT: TOPICAL_OINTMENT | OPHTHALMIC | Status: DC | PRN

## 2017-02-08 MED ORDER — MIDAZOLAM HCL 2 MG/2ML IJ SOLN
INTRAMUSCULAR | Status: AC
  Filled 2017-02-08: qty 2

## 2017-02-08 MED ORDER — ONDANSETRON HCL 4 MG/2ML IJ SOLN
INTRAMUSCULAR | Status: DC | PRN
  Administered 2017-02-08: 4 mg via INTRAVENOUS

## 2017-02-08 MED ORDER — ARTIFICIAL TEARS OPHTHALMIC OINT
TOPICAL_OINTMENT | OPHTHALMIC | Status: DC | PRN
  Administered 2017-02-08: 1 via OPHTHALMIC

## 2017-02-08 MED ORDER — LEVOTHYROXINE SODIUM 100 MCG PO TABS: 100.0000 ug | ORAL_TABLET | Freq: Every day | ORAL | 6 refills | Status: DC

## 2017-02-08 MED ORDER — PROMETHAZINE HCL 25 MG RE SUPP
25.0000 mg | Freq: Four times a day (QID) | RECTAL | Status: DC | PRN
  Filled 2017-02-08: qty 1

## 2017-02-08 MED ORDER — FENTANYL CITRATE (PF) 100 MCG/2ML IJ SOLN
25.0000 ug | INTRAMUSCULAR | Status: DC | PRN
  Administered 2017-02-08 (×2): 50 ug via INTRAVENOUS

## 2017-02-08 MED ORDER — MIDAZOLAM HCL 5 MG/5ML IJ SOLN
INTRAMUSCULAR | Status: DC | PRN
  Administered 2017-02-08: 2 mg via INTRAVENOUS

## 2017-02-08 MED ORDER — DEXAMETHASONE SODIUM PHOSPHATE 10 MG/ML IJ SOLN
INTRAMUSCULAR | Status: DC | PRN
  Administered 2017-02-08: 5 mg via INTRAVENOUS

## 2017-02-08 MED ORDER — DIPHENHYDRAMINE HCL 50 MG/ML IJ SOLN
INTRAMUSCULAR | Status: AC
  Filled 2017-02-08: qty 1

## 2017-02-08 MED ORDER — PROPOFOL 10 MG/ML IV BOLUS
INTRAVENOUS | Status: AC
  Filled 2017-02-08: qty 20

## 2017-02-08 MED ORDER — LEVOTHYROXINE SODIUM 100 MCG PO TABS
100.0000 ug | ORAL_TABLET | Freq: Every day | ORAL | Status: DC
  Administered 2017-02-09: 100 ug via ORAL
  Filled 2017-02-08: qty 1

## 2017-02-08 MED ORDER — FENTANYL CITRATE (PF) 100 MCG/2ML IJ SOLN
INTRAMUSCULAR | Status: AC
  Administered 2017-02-08: 50 ug via INTRAVENOUS
  Filled 2017-02-08: qty 2

## 2017-02-08 MED ORDER — PROMETHAZINE HCL 25 MG/ML IJ SOLN
INTRAMUSCULAR | Status: AC
  Filled 2017-02-08: qty 1

## 2017-02-08 MED ORDER — PHENYLEPHRINE 40 MCG/ML (10ML) SYRINGE FOR IV PUSH (FOR BLOOD PRESSURE SUPPORT)
PREFILLED_SYRINGE | INTRAVENOUS | Status: AC
  Filled 2017-02-08: qty 10

## 2017-02-08 MED ORDER — FENTANYL CITRATE (PF) 250 MCG/5ML IJ SOLN
INTRAMUSCULAR | Status: AC
  Filled 2017-02-08: qty 5

## 2017-02-08 MED ORDER — DEXTROSE-NACL 5-0.9 % IV SOLN
INTRAVENOUS | Status: DC
  Administered 2017-02-08: 13:00:00 via INTRAVENOUS

## 2017-02-08 MED ORDER — SUGAMMADEX SODIUM 200 MG/2ML IV SOLN
INTRAVENOUS | Status: DC | PRN
  Administered 2017-02-08: 181.4 mg via INTRAVENOUS

## 2017-02-08 MED ORDER — HYDROCODONE-ACETAMINOPHEN 5-325 MG PO TABS
1.0000 | ORAL_TABLET | ORAL | Status: DC | PRN
  Administered 2017-02-08 – 2017-02-09 (×5): 2 via ORAL
  Filled 2017-02-08 (×5): qty 2

## 2017-02-08 MED ORDER — ROCURONIUM BROMIDE 100 MG/10ML IV SOLN
INTRAVENOUS | Status: DC | PRN
  Administered 2017-02-08: 50 mg via INTRAVENOUS
  Administered 2017-02-08: 20 mg via INTRAVENOUS

## 2017-02-08 MED ORDER — IBUPROFEN 400 MG PO TABS: 400.0000 mg | ORAL_TABLET | Freq: Three times a day (TID) | ORAL | Status: DC | PRN

## 2017-02-08 MED ORDER — LIDOCAINE 2% (20 MG/ML) 5 ML SYRINGE
INTRAMUSCULAR | Status: AC
  Filled 2017-02-08: qty 5

## 2017-02-08 MED ORDER — PROMETHAZINE HCL 25 MG RE SUPP: 25.0000 mg | Freq: Four times a day (QID) | RECTAL | 1 refills | Status: DC | PRN

## 2017-02-08 MED ORDER — PHENYLEPHRINE HCL 10 MG/ML IJ SOLN
INTRAMUSCULAR | Status: DC | PRN
  Administered 2017-02-08 (×2): 80 ug via INTRAVENOUS

## 2017-02-08 MED ORDER — FAMOTIDINE 10 MG PO TABS
10.0000 mg | ORAL_TABLET | Freq: Every day | ORAL | Status: DC
  Administered 2017-02-08 – 2017-02-09 (×2): 10 mg via ORAL
  Filled 2017-02-08 (×2): qty 1

## 2017-02-08 MED ORDER — ADULT MULTIVITAMIN W/MINERALS CH: 1.0000 | ORAL_TABLET | Freq: Every evening | ORAL | Status: DC

## 2017-02-08 MED ORDER — ONDANSETRON HCL 4 MG/2ML IJ SOLN
INTRAMUSCULAR | Status: AC
  Filled 2017-02-08: qty 2

## 2017-02-08 MED ORDER — DEXAMETHASONE SODIUM PHOSPHATE 10 MG/ML IJ SOLN
INTRAMUSCULAR | Status: AC
  Filled 2017-02-08: qty 1

## 2017-02-08 SURGICAL SUPPLY — 40 items
BLADE SURG 15 STRL LF DISP TIS (BLADE) ×1 IMPLANT
BLADE SURG 15 STRL SS (BLADE) ×1
CANISTER SUCT 3000ML PPV (MISCELLANEOUS) ×2 IMPLANT
CLEANER TIP ELECTROSURG 2X2 (MISCELLANEOUS) ×2 IMPLANT
CONT SPEC 4OZ CLIKSEAL STRL BL (MISCELLANEOUS) ×2 IMPLANT
CORDS BIPOLAR (ELECTRODE) ×2 IMPLANT
COVER SURGICAL LIGHT HANDLE (MISCELLANEOUS) ×2 IMPLANT
DERMABOND ADVANCED (GAUZE/BANDAGES/DRESSINGS) ×1
DERMABOND ADVANCED .7 DNX12 (GAUZE/BANDAGES/DRESSINGS) ×1 IMPLANT
DRAIN HEMOVAC 7FR (DRAIN) IMPLANT
DRAIN SNY 10 ROU (WOUND CARE) ×2 IMPLANT
ELECT COATED BLADE 2.86 ST (ELECTRODE) ×2 IMPLANT
ELECT REM PT RETURN 9FT ADLT (ELECTROSURGICAL) ×2
ELECTRODE REM PT RTRN 9FT ADLT (ELECTROSURGICAL) ×1 IMPLANT
EVACUATOR SILICONE 100CC (DRAIN) ×2 IMPLANT
FORCEPS BIPOLAR SPETZLER 8 1.0 (NEUROSURGERY SUPPLIES) ×2 IMPLANT
GAUZE SPONGE 4X4 16PLY XRAY LF (GAUZE/BANDAGES/DRESSINGS) ×2 IMPLANT
GLOVE BIOGEL PI IND STRL 7.0 (GLOVE) ×2 IMPLANT
GLOVE BIOGEL PI INDICATOR 7.0 (GLOVE) ×2
GLOVE ECLIPSE 6.5 STRL STRAW (GLOVE) ×2 IMPLANT
GLOVE ECLIPSE 7.5 STRL STRAW (GLOVE) ×2 IMPLANT
GLOVE ECLIPSE 8.0 STRL XLNG CF (GLOVE) ×2 IMPLANT
GOWN STRL REUS W/ TWL LRG LVL3 (GOWN DISPOSABLE) ×3 IMPLANT
GOWN STRL REUS W/TWL LRG LVL3 (GOWN DISPOSABLE) ×6
KIT BASIN OR (CUSTOM PROCEDURE TRAY) ×2 IMPLANT
KIT ROOM TURNOVER OR (KITS) ×2 IMPLANT
NEEDLE PRECISIONGLIDE 27X1.5 (NEEDLE) ×2 IMPLANT
NS IRRIG 1000ML POUR BTL (IV SOLUTION) ×2 IMPLANT
PAD ARMBOARD 7.5X6 YLW CONV (MISCELLANEOUS) ×4 IMPLANT
PENCIL FOOT CONTROL (ELECTRODE) ×2 IMPLANT
SHEARS HARMONIC 9CM CVD (BLADE) ×2 IMPLANT
STAPLER VISISTAT 35W (STAPLE) ×2 IMPLANT
SUT CHROMIC 3 0 PS 2 (SUTURE) ×2 IMPLANT
SUT CHROMIC 4 0 PS 2 18 (SUTURE) ×2 IMPLANT
SUT ETHILON 3 0 PS 1 (SUTURE) ×2 IMPLANT
SUT SILK 2 0 SH (SUTURE) ×2 IMPLANT
SUT SILK 3 0 REEL (SUTURE) ×2 IMPLANT
SUT SILK 4 0 REEL (SUTURE) ×2 IMPLANT
TOWEL OR 17X24 6PK STRL BLUE (TOWEL DISPOSABLE) ×2 IMPLANT
TRAY ENT MC OR (CUSTOM PROCEDURE TRAY) ×2 IMPLANT

## 2017-02-08 NOTE — Discharge Instructions (Signed)
You may shower and use soap and water. Do not use any creams, oils or ointment. ° °

## 2017-02-08 NOTE — Anesthesia Preprocedure Evaluation (Signed)
Anesthesia Evaluation  Patient identified by MRN, date of birth, ID band Patient awake    Reviewed: Allergy & Precautions, H&P , Patient's Chart, lab work & pertinent test results, reviewed documented beta blocker date and time   Airway Mallampati: II  TM Distance: >3 FB Neck ROM: full    Dental no notable dental hx.    Pulmonary    Pulmonary exam normal breath sounds clear to auscultation       Cardiovascular hypertension,  Rhythm:regular Rate:Normal     Neuro/Psych    GI/Hepatic   Endo/Other    Renal/GU      Musculoskeletal   Abdominal   Peds  Hematology   Anesthesia Other Findings   Reproductive/Obstetrics                             Anesthesia Physical Anesthesia Plan  ASA: II  Anesthesia Plan: General   Post-op Pain Management:    Induction: Intravenous  Airway Management Planned: Oral ETT  Additional Equipment:   Intra-op Plan:   Post-operative Plan: Extubation in OR  Informed Consent: I have reviewed the patients History and Physical, chart, labs and discussed the procedure including the risks, benefits and alternatives for the proposed anesthesia with the patient or authorized representative who has indicated his/her understanding and acceptance.   Dental Advisory Given  Plan Discussed with: CRNA and Surgeon  Anesthesia Plan Comments: (  )        Anesthesia Quick Evaluation

## 2017-02-08 NOTE — Anesthesia Procedure Notes (Signed)
Procedure Name: Intubation Date/Time: 02/08/2017 9:11 AM Performed by: Salli Quarry Violetta Lavalle Pre-anesthesia Checklist: Patient identified, Emergency Drugs available, Suction available and Patient being monitored Patient Re-evaluated:Patient Re-evaluated prior to inductionOxygen Delivery Method: Circle System Utilized Preoxygenation: Pre-oxygenation with 100% oxygen Intubation Type: IV induction Ventilation: Mask ventilation without difficulty Laryngoscope Size: Miller and 3 Grade View: Grade I Tube type: Oral Tube size: 7.0 mm Number of attempts: 1 Airway Equipment and Method: Stylet and Oral airway Placement Confirmation: ETT inserted through vocal cords under direct vision,  positive ETCO2 and breath sounds checked- equal and bilateral Secured at: 20 cm Tube secured with: Tape Dental Injury: Teeth and Oropharynx as per pre-operative assessment  Comments: Intubation by Brooke Bonito, SRNA

## 2017-02-08 NOTE — Transfer of Care (Signed)
Immediate Anesthesia Transfer of Care Note  Patient: Natasha Fernandez  Procedure(s) Performed: Procedure(s): TOTAL THYROIDECTOMY (N/A)  Patient Location: PACU  Anesthesia Type:General  Level of Consciousness: awake, alert , oriented and patient cooperative  Airway & Oxygen Therapy: Patient Spontanous Breathing and Patient connected to nasal cannula oxygen  Post-op Assessment: Report given to RN and Post -op Vital signs reviewed and stable  Post vital signs: Reviewed and stable  Last Vitals:  Vitals:   02/08/17 0721  BP: 127/88  Pulse: 81  Resp: 20  Temp: 37.1 C    Last Pain:  Vitals:   02/08/17 0721  TempSrc: Oral         Complications: No apparent anesthesia complications

## 2017-02-08 NOTE — H&P (View-Only) (Signed)
  HPI: She was having difficulty with fullness in her throat and tightness in her throat, occasional trouble swallowing. She was also having increasing frequency of heartburn. She drinks about 4 or 5 servings of diet cola daily. He ended up having a thyroid examination that revealed a nodule. She had an ultrasound and then a biopsy which revealed papillary thyroid cancer. Otherwise in very good health.  PMH/Meds/All/SocHx/FamHx/ROS:   Past Medical History:  Diagnosis Date  . IBS (irritable bowel syndrome)   Past Surgical History:  Procedure Laterality Date  . CHOLECYSTECTOMY  . MOUTH SURGERY   No family history of bleeding disorders, wound healing problems or difficulty with anesthesia.   Social History   Social History  . Marital status: N/A  Spouse name: N/A  . Number of children: N/A  . Years of education: N/A   Occupational History  . Not on file.   Social History Main Topics  . Smoking status: Never Smoker  . Smokeless tobacco: Never Used  . Alcohol use Not on file  . Drug use: Unknown  . Sexual activity: Not on file   Other Topics Concern  . Not on file   Social History Narrative  . No narrative on file   No current outpatient prescriptions on file.  A complete ROS was performed with pertinent positives/negatives noted in the HPI. The remainder of the ROS are negative.   Physical Exam:   Ht 1.727 m (5\' 8" )  Wt 86.2 kg (190 lb)  BMI 28.89 kg/m   General: Healthy and alert, in no distress, breathing easily. Normal affect. In a pleasant mood. Head: Normocephalic, atraumatic. No masses, or scars. Eyes: Pupils are equal, and reactive to light. Vision is grossly intact. No spontaneous or gaze nystagmus. Ears: Ear canals are clear. Tympanic membranes are intact, with normal landmarks and the middle ears are clear and healthy. Hearing: Grossly normal. Nose: Nasal cavities are clear with healthy mucosa, no polyps or exudate.Airways are patent. Face: No masses or  scars, facial nerve function is symmetric. Oral Cavity: No mucosal abnormalities are noted. Tongue with normal mobility. Dentition appears healthy. Oropharynx: Tonsils are symmetric. There are no mucosal masses identified. Tongue base appears normal and healthy. Larynx/Hypopharynx: indirect exam reveals healthy, mobile vocal cords, without mucosal lesions in the hypopharynx or larynx. Chest: Deferred Neck: No palpable masses, no cervical adenopathy, there is a palpable thyroid nodule on the right, approximately 2 cm. Neuro: Cranial nerves II-XII will normal function. Balance: Normal gate. Other findings: none.  Independent Review of Additional Tests or Records:  none  Procedures:  none  Impression & Plans:  Papillary thyroid cancer, biopsy-proven. Recommend total thyroidectomy. Risks and benefits were discussed including hypocalcemia, recurrent nerve injury, bleeding, infection, anesthetic complications.  Her actual symptoms were probably reflux related.We discussed causes of reflux, including lifestyle and dietary factors. Recommend strict avoidance of all tobacco, caffeine, alcohol, chocolate and peppermint. A reflux handout with more detailed instructions was provided to the patient.

## 2017-02-08 NOTE — Care Management Note (Signed)
Case Management Note  Patient Details  Name: Natasha Fernandez MRN: [id number] Date of Birth: 26-Nov-1985  Subjective/Objective:     S/p Total Thyroidectomy               Action/Plan: Discharge Planning: Chart reviewed. No NCM needs identified. Will continue to follow for dc needs.   PCP Midge Minium MD  Expected Discharge Date:                  Expected Discharge Plan:  Home/Self Care  In-House Referral:  NA  Discharge planning Services  CM Consult  Post Acute Care Choice:  NA Choice offered to:  NA  DME Arranged:  N/A DME Agency:  NA  HH Arranged:  NA HH Agency:  NA  Status of Service:  Completed, signed off  If discussed at [place] of Stay Meetings, dates discussed:    Additional Comments:  Erenest Rasher, RN 02/08/2017, 3:51 PM

## 2017-02-08 NOTE — Op Note (Signed)
OPERATIVE REPORT  DATE OF SURGERY: 02/08/2017  PATIENT:  Natasha Fernandez,  31 y.o. female  PRE-OPERATIVE DIAGNOSIS:  Papillary Carcinoma of Thyroid  POST-OPERATIVE DIAGNOSIS:  Papillary Carcinoma of Thyroid  PROCEDURE:  Procedure(s): TOTAL THYROIDECTOMY  SURGEON:  Beckie Salts, MD  ASSISTANTS: Jeralene Peters RNFA  ANESTHESIA:   General   EBL:  30 ml  DRAINS: 10 French round   LOCAL MEDICATIONS USED:  None  SPECIMEN:  Total thyroidectomy in 2 parts, suture marks the right lobe  COUNTS:  Correct  PROCEDURE DETAILS: The patient was taken to the operating room and placed on the operating table in the supine position. A shoulder roll was placed beneath the shoulder blades and the neck was extended. The neck was prepped and draped in a standard fashion. A low collar transverse incision was outlined with a marking pen and was incised with electrocautery . Dissection was continued down through the platysma layer.  Self-retaining retractors were used throughout the case.  The midline fascia was divided. The strap muscles were reflected off of the lobes bilaterally, starting with the right. A firm 1.5 cm mass was identified in the right anterior middle portion of the gland. Other smaller nodules were also identified. The superior vasculature was identified and divided using the harmonic dissector. Once suspected parathyroid was identified on both sides. These were preserved with their blood supply. The middle thyroid vein was ligated and divided. Inferior vasculature was divided using Harmonic dissector. The dissection remained immediately adjacent to the capsule of the gland. The recurrent nerve was not identified until after the dissection. Berry ligament was divided and the right lobe was brought off the trachea. The isthmus was divided just to the left of the large nodule. This was marked with a single suture.  The left side was dissected in a similar fashion. The nerve was also  identified after the dissection. There are no specific nodules on the left side. There were no other palpable lymph nodes or other nodules surrounding the thyroid bed or in the peritracheal space.  The drain was secured in place with a nylon suture. The midline fascia was reapproximated with interrupted chromic suture. The platysma layer was also reapproximated with interrupted chromic suture. A running subcuticular closure was accomplished. Dermabond was used on the skin. The drain was charged. The patient was awakened, extubated and transferred to recovery in stable condition.   PATIENT DISPOSITION:  To PACU, stable

## 2017-02-08 NOTE — Interval H&P Note (Signed)
History and Physical Interval Note:  02/08/2017 8:45 AM  Natasha Fernandez  has presented today for surgery, with the diagnosis of papillary carcinoma of thyroid  The various methods of treatment have been discussed with the patient and family. After consideration of risks, benefits and other options for treatment, the patient has consented to  Procedure(s): TOTAL THYROIDECTOMY (N/A) as a surgical intervention .  The patient's history has been reviewed, patient examined, no change in status, stable for surgery.  I have reviewed the patient's chart and labs.  Questions were answered to the patient's satisfaction.     Wesleigh Markovic

## 2017-02-09 ENCOUNTER — Encounter (HOSPITAL_COMMUNITY): Payer: Self-pay | Admitting: Otolaryngology

## 2017-02-09 DIAGNOSIS — C73 Malignant neoplasm of thyroid gland: Secondary | ICD-10-CM | POA: Diagnosis not present

## 2017-02-09 LAB — CALCIUM: Calcium: 8.4 mg/dL — ABNORMAL LOW (ref 8.9–10.3)

## 2017-02-09 MED ORDER — PROMETHAZINE HCL 12.5 MG PO TABS: 12.5000 mg | ORAL_TABLET | Freq: Four times a day (QID) | ORAL | 0 refills | Status: DC | PRN

## 2017-02-09 NOTE — Discharge Summary (Signed)
  Physician Discharge Summary  Patient ID: Natasha Fernandez MRN: [id number] DOB/AGE: 33-Sep-1987 33 y.o.  Admit date: 02/08/2017 Discharge date: 02/09/2017  Admission Diagnoses:Thyroid carcinoma  Discharge Diagnoses:  Active Problems:   Thyroid cancer [facility])   Discharged Condition: good  Hospital Course: No complications, pain controlled with medication. No significant nausea. Able to swallow. Calciums have remained stable.  Consults: none  Significant Diagnostic Studies: none  Treatments: surgery: Total thyroidectomy  Discharge Exam: Blood pressure 107/61, pulse 69, temperature 98.7 F (37.1 C), temperature source Oral, resp. rate 17, weight 90.7 kg (200 lb), last menstrual period 02/03/2017, SpO2 99 %. PHYSICAL EXAM: Incision looks excellent. Drain was removed. Breathing is clear. She is able to speak but her voice is a little bit weak. I'm not sure if it's because of the discomfort. She seems to have a good glottic stop when she coughs.  Disposition: 01-Home or Self Care  Discharge Instructions    Diet - low sodium heart healthy    Complete by:  As directed    Increase activity slowly    Complete by:  As directed      Allergies as of 02/09/2017      Reactions   Influenza Vaccine Live Anaphylaxis      Medication List    TAKE these medications   cholecalciferol 1000 units tablet Commonly known as:  VITAMIN D Take 1,000 Units by mouth 2 (two) times a week.   HYDROcodone-acetaminophen 7.5-325 MG tablet Commonly known as:  NORCO Take 1 tablet by mouth every 6 (six) hours as needed for moderate pain.   ibuprofen 200 MG tablet Commonly known as:  ADVIL,MOTRIN Take 400 mg by mouth every 8 (eight) hours as needed (for pain.).   levothyroxine 100 MCG tablet Commonly known as:  SYNTHROID Take 1 tablet (100 mcg total) by mouth daily.   multivitamin with minerals Tabs tablet Take 1 tablet by mouth every evening.   promethazine 25 MG suppository Commonly known as:   PHENERGAN Place 1 suppository (25 mg total) rectally every 6 (six) hours as needed for nausea or vomiting.   promethazine 12.5 MG tablet Commonly known as:  PHENERGAN Take 1 tablet (12.5 mg total) by mouth every 6 (six) hours as needed for nausea or vomiting.   ranitidine 150 MG capsule Commonly known as:  ZANTAC Take 150 mg by mouth as needed for heartburn.      Follow-up Information    Ceyda Peterka, MD. Schedule an appointment as soon as possible for a visit in 1 week.   Specialty:  Otolaryngology Contact information: [address] [place] [address] [phone]           Signed: Izora Gala 02/09/2017, 8:43 AM

## 2017-02-09 NOTE — Progress Notes (Signed)
Natasha Fernandez to be D/C'd  per MD order. Discussed with the patient and all questions fully answered.  VSS, Skin clean, dry and intact without evidence of skin break down, no evidence of skin tears noted.  IV catheter discontinued intact. Site without signs and symptoms of complications. Dressing and pressure applied.  An After Visit Summary was printed and given to the patient. Patient received prescription.  D/c education completed with patient/family including follow up instructions, medication list, d/c activities limitations if indicated, with other d/c instructions as indicated by MD - patient able to verbalize understanding, all questions fully answered.   Patient instructed to return to ED, call 911, or call MD for any changes in condition.   Patient to be escorted via [place], and D/C home via private auto.

## 2017-02-14 DIAGNOSIS — J3800 Paralysis of vocal cords and larynx, unspecified: Secondary | ICD-10-CM | POA: Insufficient documentation

## 2017-02-14 NOTE — Anesthesia Postprocedure Evaluation (Signed)
Anesthesia Post Note  Patient: Natasha Fernandez  Procedure(s) Performed: Procedure(s) (LRB): TOTAL THYROIDECTOMY (N/A)  Anesthesia Type: General        Last Vitals:  Vitals:   02/08/17 2128 02/09/17 0637  BP: 101/62 107/61  Pulse: 66 69  Resp: 17 17  Temp:  37.1 C    Last Pain:  Vitals:   02/09/17 0637  TempSrc: Oral  PainSc:    Pain Goal: Patients Stated Pain Goal: 2 (02/09/17 0521)               Riccardo Dubin

## 2017-02-23 DIAGNOSIS — Z01419 Encounter for gynecological examination (general) (routine) without abnormal findings: Secondary | ICD-10-CM | POA: Diagnosis not present

## 2017-02-23 DIAGNOSIS — Z124 Encounter for screening for malignant neoplasm of cervix: Secondary | ICD-10-CM | POA: Diagnosis not present

## 2017-02-23 LAB — HM PAP SMEAR

## 2017-02-28 ENCOUNTER — Encounter: Payer: Self-pay | Admitting: General Practice

## 2017-03-03 ENCOUNTER — Other Ambulatory Visit: Payer: 59

## 2017-03-06 DIAGNOSIS — J3801 Paralysis of vocal cords and larynx, unilateral: Secondary | ICD-10-CM | POA: Diagnosis not present

## 2017-03-06 DIAGNOSIS — J385 Laryngeal spasm: Secondary | ICD-10-CM | POA: Diagnosis not present

## 2017-03-06 DIAGNOSIS — R490 Dysphonia: Secondary | ICD-10-CM | POA: Diagnosis not present

## 2017-03-07 DIAGNOSIS — C73 Malignant neoplasm of thyroid gland: Secondary | ICD-10-CM | POA: Diagnosis not present

## 2017-03-07 DIAGNOSIS — E890 Postprocedural hypothyroidism: Secondary | ICD-10-CM | POA: Diagnosis not present

## 2017-03-08 ENCOUNTER — Encounter: Payer: 59 | Admitting: Family Medicine

## 2017-04-04 DIAGNOSIS — R490 Dysphonia: Secondary | ICD-10-CM | POA: Diagnosis not present

## 2017-04-04 DIAGNOSIS — Z85850 Personal history of malignant neoplasm of thyroid: Secondary | ICD-10-CM | POA: Diagnosis not present

## 2017-04-04 DIAGNOSIS — Z887 Allergy status to serum and vaccine status: Secondary | ICD-10-CM | POA: Diagnosis not present

## 2017-04-04 DIAGNOSIS — J3801 Paralysis of vocal cords and larynx, unilateral: Secondary | ICD-10-CM | POA: Diagnosis not present

## 2017-04-04 DIAGNOSIS — J3800 Paralysis of vocal cords and larynx, unspecified: Secondary | ICD-10-CM | POA: Diagnosis not present

## 2017-04-12 ENCOUNTER — Other Ambulatory Visit (HOSPITAL_COMMUNITY): Payer: Self-pay | Admitting: Endocrinology

## 2017-04-12 DIAGNOSIS — C73 Malignant neoplasm of thyroid gland: Secondary | ICD-10-CM

## 2017-04-12 DIAGNOSIS — E890 Postprocedural hypothyroidism: Secondary | ICD-10-CM | POA: Diagnosis not present

## 2017-04-14 DIAGNOSIS — R490 Dysphonia: Secondary | ICD-10-CM | POA: Diagnosis not present

## 2017-04-14 DIAGNOSIS — Z09 Encounter for follow-up examination after completed treatment for conditions other than malignant neoplasm: Secondary | ICD-10-CM | POA: Diagnosis not present

## 2017-04-14 DIAGNOSIS — J3800 Paralysis of vocal cords and larynx, unspecified: Secondary | ICD-10-CM | POA: Diagnosis not present

## 2017-04-14 DIAGNOSIS — I252 Old myocardial infarction: Secondary | ICD-10-CM | POA: Diagnosis not present

## 2017-04-14 DIAGNOSIS — Z9089 Acquired absence of other organs: Secondary | ICD-10-CM | POA: Diagnosis not present

## 2017-04-14 DIAGNOSIS — K219 Gastro-esophageal reflux disease without esophagitis: Secondary | ICD-10-CM | POA: Diagnosis not present

## 2017-04-28 NOTE — Anesthesia Postprocedure Evaluation (Signed)
Anesthesia Post Note  Patient: Natalia O Episcopo  Procedure(s) Performed: Procedure(s) (LRB): TOTAL THYROIDECTOMY (N/A)     Anesthesia Post Evaluation  Last Vitals:  Vitals:   02/08/17 2128 02/09/17 0637  BP: 101/62 107/61  Pulse: 66 69  Resp: 17 17  Temp:  37.1 C    Last Pain:  Vitals:   02/09/17 0637  TempSrc: Oral  PainSc:                  Riccardo Dubin

## 2017-04-28 NOTE — Addendum Note (Signed)
Addendum  created 04/28/17 1200 by Lyndle Herrlich, MD   Sign clinical note

## 2017-05-16 DIAGNOSIS — J3801 Paralysis of vocal cords and larynx, unilateral: Secondary | ICD-10-CM | POA: Diagnosis not present

## 2017-05-16 DIAGNOSIS — J3800 Paralysis of vocal cords and larynx, unspecified: Secondary | ICD-10-CM | POA: Diagnosis not present

## 2017-05-16 DIAGNOSIS — R490 Dysphonia: Secondary | ICD-10-CM | POA: Diagnosis not present

## 2017-05-16 DIAGNOSIS — C73 Malignant neoplasm of thyroid gland: Secondary | ICD-10-CM | POA: Diagnosis not present

## 2017-05-16 DIAGNOSIS — K219 Gastro-esophageal reflux disease without esophagitis: Secondary | ICD-10-CM | POA: Diagnosis not present

## 2017-05-18 ENCOUNTER — Other Ambulatory Visit: Payer: 59

## 2017-05-22 ENCOUNTER — Encounter (HOSPITAL_COMMUNITY)
Admission: RE | Admit: 2017-05-22 | Discharge: 2017-05-22 | Disposition: A | Discharge status: Home / Self Care | Payer: 59 | Source: Ambulatory Visit | Attending: Endocrinology | Admitting: Endocrinology

## 2017-05-22 DIAGNOSIS — C73 Malignant neoplasm of thyroid gland: Secondary | ICD-10-CM | POA: Insufficient documentation

## 2017-05-22 MED ORDER — THYROTROPIN ALFA 1.1 MG IM SOLR
0.9000 mg | INTRAMUSCULAR | Status: AC
  Administered 2017-05-22: 0.9 mg via INTRAMUSCULAR

## 2017-05-22 MED ORDER — STERILE WATER FOR INJECTION IJ SOLN
INTRAMUSCULAR | Status: AC
  Administered 2017-05-22: 09:00:00
  Filled 2017-05-22: qty 10

## 2017-05-22 NOTE — Discharge Instructions (Signed)
Thyrotropin Alfa injection What is this medicine? THYROTROPIN ALFA (thahy ruh TROH pin AL fa) is a man-made protein. It is used to diagnose any remaining thyroid cancer after treatment. It is also used to help treat thyroid cancer. This medicine may be used for other purposes; ask your health care provider or pharmacist if you have questions. COMMON BRAND NAME(S): Thyrogen What should I tell my health care provider before I take this medicine? They need to know if you have any of these conditions: -cancer that has spread to other parts of the body -have thyroid tissue remaining -heart disease -kidney disease -smoke tobacco -an unusual or allergic reaction to thyrotropin, thyroid products, other hormones, medicines, foods, or preservatives -pregnant or trying to get pregnant -breast-feeding How should I use this medicine? This medicine is for injection into a muscle. It is given by a health care professional in a hospital or clinic setting. Talk to your pediatrician regarding the use of this medicine in children. Special care may be needed. Overdosage: If you think you have taken too much of this medicine contact a poison control center or emergency room at once. NOTE: This medicine is only for you. Do not share this medicine with others. What if I miss a dose? It is important not to miss your dose. Call your doctor or health care professional if you are unable to keep an appointment. What may interact with this medicine? Interactions are not expected. This list may not describe all possible interactions. Give your health care provider a list of all the medicines, herbs, non-prescription drugs, or dietary supplements you use. Also tell them if you smoke, drink alcohol, or use illegal drugs. Some items may interact with your medicine. What should I watch for while using this medicine? Visit your doctor or health care professional regularly. Your condition will be monitored carefully while you  are receiving this medicine. You may need blood work done while you are taking this medicine. What side effects may I notice from receiving this medicine? Side effects that you should report to your doctor or health care professional as soon as possible: -allergic reactions like skin rash, itching or hives, swelling of the face, lips, or tongue -breathing problems -chest pain -signs and symptoms of a stroke like changes in vision; confusion; trouble speaking or understanding; severe headaches; sudden numbness or weakness of the face, arm or leg; trouble walking; dizziness; loss of balance or coordination Side effects that usually do not require medical attention (report to your doctor or health care professional if they continue or are bothersome): -dizziness -flu-like symptoms -headache -nausea, vomiting -weak or tired This list may not describe all possible side effects. Call your doctor for medical advice about side effects. You may report side effects to FDA at 1-800-FDA-1088. Where should I keep my medicine? This drug is given in a hospital or clinic and will not be stored at home. NOTE: This sheet is a summary. It may not cover all possible information. If you have questions about this medicine, talk to your doctor, pharmacist, or health care provider.  2018 Elsevier/Gold Standard (2015-02-27 15:04:47)

## 2017-05-23 ENCOUNTER — Encounter (HOSPITAL_COMMUNITY)
Admission: RE | Admit: 2017-05-23 | Discharge: 2017-05-23 | Disposition: A | Discharge status: Home / Self Care | Payer: 59 | Source: Ambulatory Visit | Attending: Endocrinology | Admitting: Endocrinology

## 2017-05-23 DIAGNOSIS — C73 Malignant neoplasm of thyroid gland: Secondary | ICD-10-CM | POA: Diagnosis not present

## 2017-05-23 MED ORDER — THYROTROPIN ALFA 1.1 MG IM SOLR
0.9000 mg | INTRAMUSCULAR | Status: AC
  Administered 2017-05-23: 0.9 mg via INTRAMUSCULAR

## 2017-05-23 MED ORDER — STERILE WATER FOR INJECTION IJ SOLN
INTRAMUSCULAR | Status: AC
  Filled 2017-05-23: qty 10

## 2017-05-24 ENCOUNTER — Encounter (HOSPITAL_COMMUNITY)
Admission: RE | Admit: 2017-05-24 | Discharge: 2017-05-24 | Disposition: A | Discharge status: Home / Self Care | Payer: 59 | Source: Ambulatory Visit | Attending: Endocrinology | Admitting: Endocrinology

## 2017-05-24 DIAGNOSIS — C73 Malignant neoplasm of thyroid gland: Secondary | ICD-10-CM | POA: Diagnosis not present

## 2017-05-24 LAB — HCG, SERUM, QUALITATIVE: Preg, Serum: NEGATIVE

## 2017-05-25 ENCOUNTER — Encounter: Payer: 59 | Admitting: Family Medicine

## 2017-05-25 DIAGNOSIS — C73 Malignant neoplasm of thyroid gland: Secondary | ICD-10-CM | POA: Diagnosis not present

## 2017-05-25 MED ORDER — SODIUM IODIDE I 131 CAPSULE
97.6000 | Freq: Once | INTRAVENOUS | Status: AC | PRN
  Administered 2017-05-24: 97.6 via ORAL

## 2017-05-31 ENCOUNTER — Encounter (HOSPITAL_COMMUNITY)
Admission: RE | Admit: 2017-05-31 | Discharge: 2017-05-31 | Disposition: A | Discharge status: Home / Self Care | Payer: 59 | Source: Ambulatory Visit | Attending: Endocrinology | Admitting: Endocrinology

## 2017-05-31 DIAGNOSIS — Z719 Counseling, unspecified: Secondary | ICD-10-CM | POA: Diagnosis not present

## 2017-05-31 DIAGNOSIS — C73 Malignant neoplasm of thyroid gland: Secondary | ICD-10-CM | POA: Insufficient documentation

## 2017-06-05 ENCOUNTER — Encounter: Payer: Self-pay | Admitting: Family Medicine

## 2017-06-05 DIAGNOSIS — C73 Malignant neoplasm of thyroid gland: Secondary | ICD-10-CM

## 2017-06-12 DIAGNOSIS — J3801 Paralysis of vocal cords and larynx, unilateral: Secondary | ICD-10-CM | POA: Diagnosis not present

## 2017-06-12 DIAGNOSIS — R490 Dysphonia: Secondary | ICD-10-CM | POA: Diagnosis not present

## 2017-06-12 DIAGNOSIS — C73 Malignant neoplasm of thyroid gland: Secondary | ICD-10-CM | POA: Diagnosis not present

## 2017-06-15 DIAGNOSIS — J3800 Paralysis of vocal cords and larynx, unspecified: Secondary | ICD-10-CM | POA: Diagnosis not present

## 2017-06-15 DIAGNOSIS — R490 Dysphonia: Secondary | ICD-10-CM | POA: Diagnosis not present

## 2017-06-15 DIAGNOSIS — J3801 Paralysis of vocal cords and larynx, unilateral: Secondary | ICD-10-CM | POA: Diagnosis not present

## 2017-06-15 DIAGNOSIS — C73 Malignant neoplasm of thyroid gland: Secondary | ICD-10-CM | POA: Diagnosis not present

## 2017-06-15 DIAGNOSIS — K219 Gastro-esophageal reflux disease without esophagitis: Secondary | ICD-10-CM | POA: Diagnosis not present

## 2017-07-14 DIAGNOSIS — D649 Anemia, unspecified: Secondary | ICD-10-CM | POA: Diagnosis not present

## 2017-07-14 DIAGNOSIS — E890 Postprocedural hypothyroidism: Secondary | ICD-10-CM | POA: Diagnosis not present

## 2017-07-14 DIAGNOSIS — C73 Malignant neoplasm of thyroid gland: Secondary | ICD-10-CM | POA: Diagnosis not present

## 2017-07-19 DIAGNOSIS — D649 Anemia, unspecified: Secondary | ICD-10-CM | POA: Diagnosis not present

## 2017-07-19 DIAGNOSIS — E890 Postprocedural hypothyroidism: Secondary | ICD-10-CM | POA: Diagnosis not present

## 2017-07-19 DIAGNOSIS — C73 Malignant neoplasm of thyroid gland: Secondary | ICD-10-CM | POA: Diagnosis not present

## 2017-07-26 DIAGNOSIS — E890 Postprocedural hypothyroidism: Secondary | ICD-10-CM | POA: Diagnosis not present

## 2017-07-26 DIAGNOSIS — C73 Malignant neoplasm of thyroid gland: Secondary | ICD-10-CM | POA: Diagnosis not present

## 2017-07-31 DIAGNOSIS — C73 Malignant neoplasm of thyroid gland: Secondary | ICD-10-CM | POA: Diagnosis not present

## 2017-08-03 DIAGNOSIS — K219 Gastro-esophageal reflux disease without esophagitis: Secondary | ICD-10-CM | POA: Diagnosis not present

## 2017-08-03 DIAGNOSIS — J3800 Paralysis of vocal cords and larynx, unspecified: Secondary | ICD-10-CM | POA: Diagnosis not present

## 2017-08-03 DIAGNOSIS — C73 Malignant neoplasm of thyroid gland: Secondary | ICD-10-CM | POA: Diagnosis not present

## 2017-08-03 DIAGNOSIS — J3801 Paralysis of vocal cords and larynx, unilateral: Secondary | ICD-10-CM | POA: Diagnosis not present

## 2017-08-07 ENCOUNTER — Other Ambulatory Visit: Payer: 59

## 2017-08-07 DIAGNOSIS — C73 Malignant neoplasm of thyroid gland: Secondary | ICD-10-CM | POA: Diagnosis not present

## 2017-08-09 DIAGNOSIS — C73 Malignant neoplasm of thyroid gland: Secondary | ICD-10-CM | POA: Diagnosis not present

## 2017-08-14 ENCOUNTER — Encounter: Payer: 59 | Admitting: Family Medicine

## 2017-10-11 DIAGNOSIS — C73 Malignant neoplasm of thyroid gland: Secondary | ICD-10-CM | POA: Diagnosis not present

## 2017-10-11 DIAGNOSIS — E890 Postprocedural hypothyroidism: Secondary | ICD-10-CM | POA: Diagnosis not present

## 2017-11-03 DIAGNOSIS — J020 Streptococcal pharyngitis: Secondary | ICD-10-CM | POA: Diagnosis not present

## 2017-11-16 ENCOUNTER — Encounter: Payer: Self-pay | Admitting: Family Medicine

## 2017-11-16 MED ORDER — OSELTAMIVIR PHOSPHATE 75 MG PO CAPS: 75.0000 mg | ORAL_CAPSULE | Freq: Every day | ORAL | 0 refills | Status: DC

## 2017-11-20 ENCOUNTER — Other Ambulatory Visit (INDEPENDENT_AMBULATORY_CARE_PROVIDER_SITE_OTHER): Payer: 59

## 2017-11-20 DIAGNOSIS — E669 Obesity, unspecified: Secondary | ICD-10-CM | POA: Diagnosis not present

## 2017-11-20 DIAGNOSIS — E559 Vitamin D deficiency, unspecified: Secondary | ICD-10-CM

## 2017-11-20 LAB — HEPATIC FUNCTION PANEL
ALT: 16 U/L (ref 0–35)
AST: 13 U/L (ref 0–37)
Albumin: 3.9 g/dL (ref 3.5–5.2)
Alkaline Phosphatase: 70 U/L (ref 39–117)
Bilirubin, Direct: 0.1 mg/dL (ref 0.0–0.3)
Total Bilirubin: 0.3 mg/dL (ref 0.2–1.2)
Total Protein: 6.9 g/dL (ref 6.0–8.3)

## 2017-11-20 LAB — BASIC METABOLIC PANEL
BUN: 12 mg/dL (ref 6–23)
CHLORIDE: 106 meq/L (ref 96–112)
CO2: 25 mEq/L (ref 19–32)
CREATININE: 0.81 mg/dL (ref 0.40–1.20)
Calcium: 9 mg/dL (ref 8.4–10.5)
GFR: 86.24 mL/min (ref >60.00)
Glucose, Bld: 102 mg/dL — ABNORMAL HIGH (ref 70–99)
Potassium: 4.3 mEq/L (ref 3.5–5.1)
Sodium: 139 mEq/L (ref 135–145)

## 2017-11-20 LAB — CBC WITH DIFFERENTIAL/PLATELET
BASOS PCT: 0.3 % (ref 0.0–3.0)
Basophils Absolute: 0 10*3/uL (ref 0.0–0.1)
Eosinophils Absolute: 0.1 10*3/uL (ref 0.0–0.7)
Eosinophils Relative: 1.6 % (ref 0.0–5.0)
HEMATOCRIT: 39.8 % (ref 36.0–46.0)
Hemoglobin: 13.1 g/dL (ref 12.0–15.0)
Lymphocytes Relative: 19.6 % (ref 12.0–46.0)
Lymphs Abs: 1.5 10*3/uL (ref 0.7–4.0)
MCHC: 33 g/dL (ref 30.0–36.0)
MCV: 89.1 fl (ref 78.0–100.0)
MONOS PCT: 9.7 % (ref 3.0–12.0)
Monocytes Absolute: 0.7 10*3/uL (ref 0.1–1.0)
Neutro Abs: 5.1 10*3/uL (ref 1.4–7.7)
Neutrophils Relative %: 68.8 % (ref 43.0–77.0)
PLATELETS: 358 10*3/uL (ref 150.0–400.0)
RBC: 4.47 Mil/uL (ref 3.87–5.11)
RDW: 13.2 % (ref 11.5–15.5)
WBC: 7.4 10*3/uL (ref 4.0–10.5)

## 2017-11-20 LAB — TSH: TSH: 0.06 u[IU]/mL — ABNORMAL LOW (ref 0.35–4.50)

## 2017-11-20 LAB — LIPID PANEL
CHOLESTEROL: 148 mg/dL (ref 0–200)
HDL: 50.6 mg/dL (ref >39.00)
LDL Cholesterol: 83 mg/dL (ref 0–99)
NonHDL: 96.94
TRIGLYCERIDES: 71 mg/dL (ref 0.0–149.0)
Total CHOL/HDL Ratio: 3
VLDL: 14.2 mg/dL (ref 0.0–40.0)

## 2017-11-20 LAB — VITAMIN D 25 HYDROXY (VIT D DEFICIENCY, FRACTURES): VITD: 16.1 ng/mL — ABNORMAL LOW (ref 30.00–100.00)

## 2017-11-21 ENCOUNTER — Other Ambulatory Visit: Payer: Self-pay | Admitting: General Practice

## 2017-11-21 MED ORDER — VITAMIN D (ERGOCALCIFEROL) 1.25 MG (50000 UNIT) PO CAPS: 50000.0000 [IU] | ORAL_CAPSULE | ORAL | 0 refills | Status: DC

## 2017-11-24 ENCOUNTER — Other Ambulatory Visit: Payer: Self-pay

## 2017-11-29 ENCOUNTER — Encounter: Payer: Self-pay | Admitting: Family Medicine

## 2017-11-29 ENCOUNTER — Other Ambulatory Visit: Payer: Self-pay

## 2017-11-29 ENCOUNTER — Ambulatory Visit (INDEPENDENT_AMBULATORY_CARE_PROVIDER_SITE_OTHER): Payer: 59 | Admitting: Family Medicine

## 2017-11-29 VITALS — BP 123/82 | HR 85 | Temp 99.0°F | Resp 16 | Ht 66.0 in | Wt 209.5 lb

## 2017-11-29 DIAGNOSIS — Z0000 Encounter for general adult medical examination without abnormal findings: Secondary | ICD-10-CM

## 2017-11-29 MED ORDER — CLONAZEPAM 0.5 MG PO TABS: 0.5000 mg | ORAL_TABLET | Freq: Two times a day (BID) | ORAL | 0 refills | Status: DC | PRN

## 2017-11-29 MED ORDER — SCOPOLAMINE 1 MG/3DAYS TD PT72: 1.0000 | MEDICATED_PATCH | TRANSDERMAL | 0 refills | Status: DC

## 2017-11-29 MED ORDER — VALACYCLOVIR HCL 1 G PO TABS: ORAL_TABLET | ORAL | 1 refills | Status: DC

## 2017-11-29 NOTE — Patient Instructions (Signed)
Follow up in 1 year or as needed Keep up the good work!  You look great! Start the Valtrex at the first sign of cold sores- 2 tabs x2 doses 12 hrs apart Use the Scopolamine patch behind your ear for the cruise- change patch every 3 days Use the Clonazepam for the flights- start w/ 1/2 tab and use a full tab if needed Call with any questions or concerns Happy Valentine's Day!!!

## 2017-11-29 NOTE — Assessment & Plan Note (Signed)
Pt's PE WNL.  UTD on GYN.  Following w/ [facility] due to thyroid cancer.  Reviewed recent labs.  Anticipatory guidance provided.

## 2017-11-29 NOTE — Progress Notes (Signed)
   Subjective:    Patient ID: Natasha Fernandez, female    DOB: [birth year], 32 y.o.   MRN: [id number]  HPI CPE- UTD on GYN.  Reviewed recent labs- WNL.  Needs Valtrex for cold sores, scopolamine for cruise, and clonazepam for upcoming flights (which she has a fear of)   Review of Systems  Patient reports no vision/ hearing changes, adenopathy,fever, weight change, swallowing issues, chest pain, palpitations, edema, persistant/recurrent cough, hemoptysis, dyspnea (rest/exertional/paroxysmal nocturnal), gastrointestinal bleeding (melena, rectal bleeding), abdominal pain, significant heartburn, bowel changes, GU symptoms (dysuria, hematuria, incontinence), Gyn symptoms (abnormal  bleeding, pain),  syncope, focal weakness, memory loss, numbness & tingling, skin/hair/nail changes, abnormal bruising or bleeding, anxiety, or depression.   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    Objective:   Physical Exam General Appearance:    Alert, cooperative, no distress, appears stated age  Head:    Normocephalic, without obvious abnormality, atraumatic  Eyes:    PERRL, conjunctiva/corneas clear, EOM's intact, fundi    benign, both eyes  Ears:    Normal TM's and external ear canals, both ears  Nose:   Nares normal, septum midline, mucosa normal, no drainage    or sinus tenderness  Throat:   Lips, mucosa, and tongue normal; teeth and gums normal  Neck:   Supple, symmetrical, trachea midline, no adenopathy;    Thyroid: no enlargement/tenderness/nodules  Back:     Symmetric, no curvature, ROM normal, no CVA tenderness  Lungs:     Clear to auscultation bilaterally, respirations unlabored  Chest Wall:    No tenderness or deformity   Heart:    Regular rate and rhythm, S1 and S2 normal, no murmur, rub   or gallop  Breast Exam:    Deferred to GYN  Abdomen:     Soft, non-tender, bowel sounds active all four quadrants,    no masses, no organomegaly  Genitalia:    Deferred to GYN  Rectal:    Extremities:    Extremities normal, atraumatic, no cyanosis or edema  Pulses:   2+ and symmetric all extremities  Skin:   Skin color, texture, turgor normal, no rashes or lesions  Lymph nodes:   Cervical, supraclavicular, and axillary nodes normal  Neurologic:   CNII-XII intact, normal strength, sensation and reflexes    throughout          Assessment & Plan:

## 2017-11-30 IMAGING — US US THYROID BIOPSY
1 series · 13 of 23 positions shown · non-contrast
Comparison: Ultrasound on January 17, 2017

MEDICATIONS:
1% lidocaine

COMPLICATIONS:
None immediate.

INDICATION: Indeterminate thyroid nodules within the right thyroid lobe.

EXAM:
ULTRASOUND GUIDED FINE NEEDLE ASPIRATION OF INDETERMINATE THYROID
NODULE
TECHNIQUE: Informed written consent was obtained from the patient after a
discussion of the risks, benefits and alternatives to treatment.
Questions regarding the procedure were encouraged and answered. A
timeout was performed prior to the initiation of the procedure.

[Series 1: us thyroid biopsy · 0.05mm/px · 23 acquisitions, 13 frames shown]
[im 1/23]
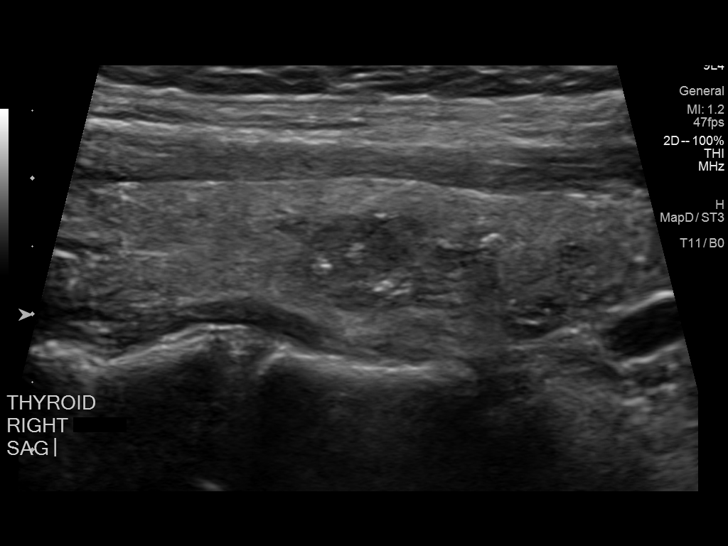
[im 3/23]
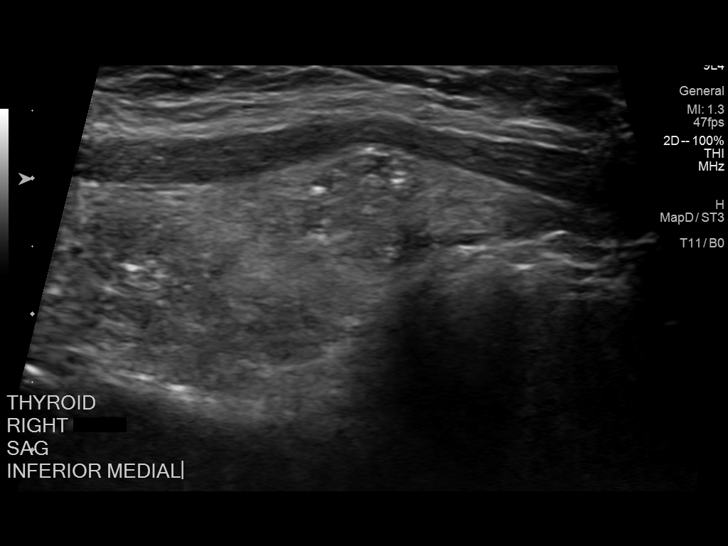
[im 5/23]
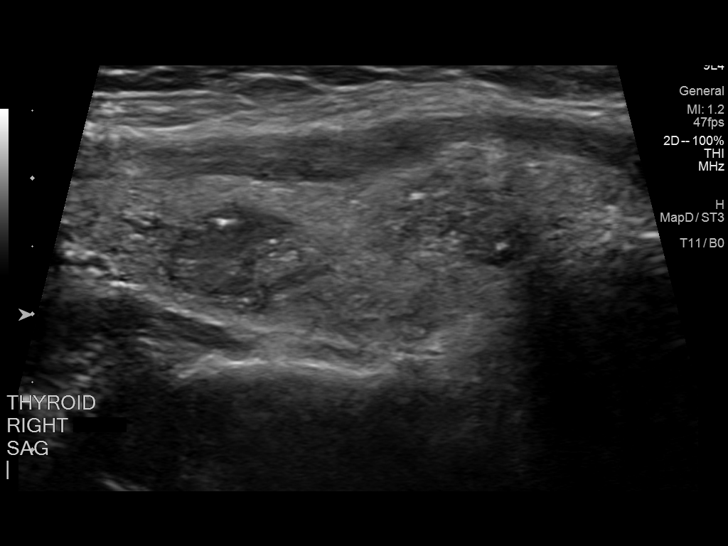
[im 7/23]
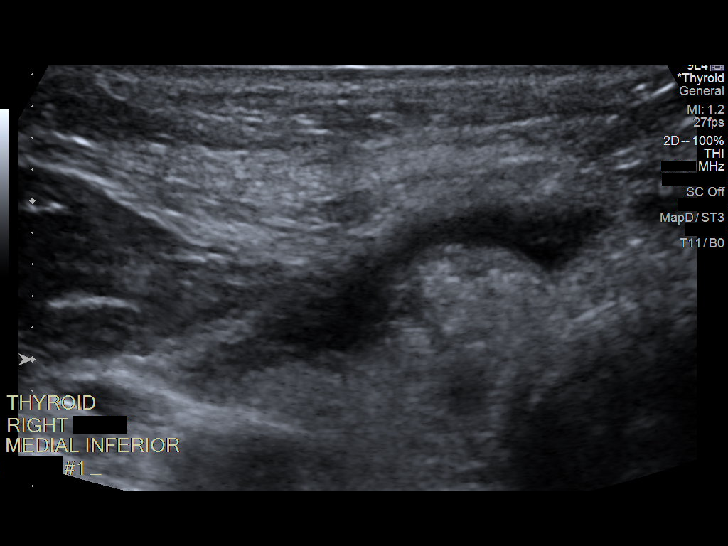
[im 8/23]
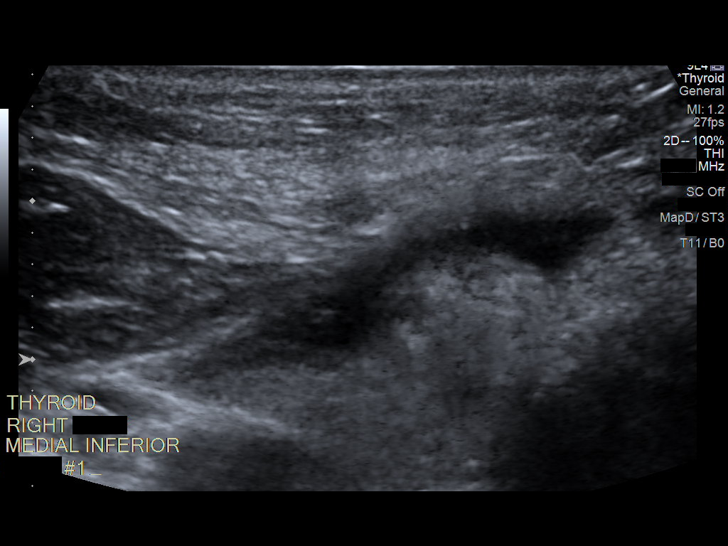
[im 10/23]
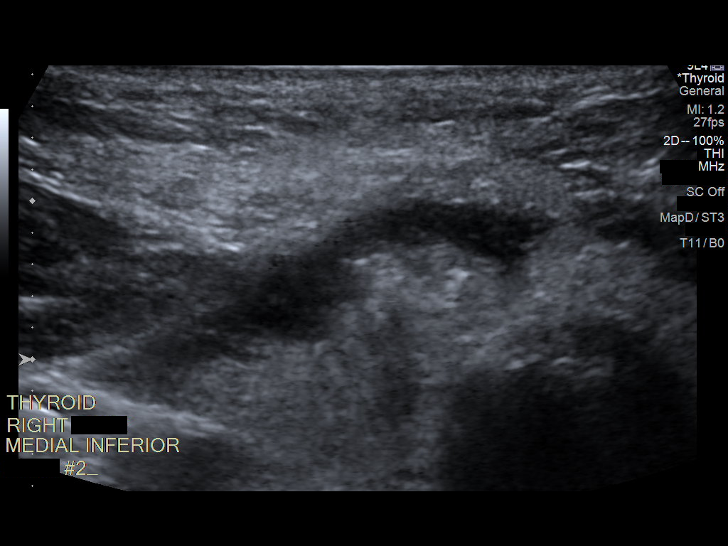
[im 12/23]
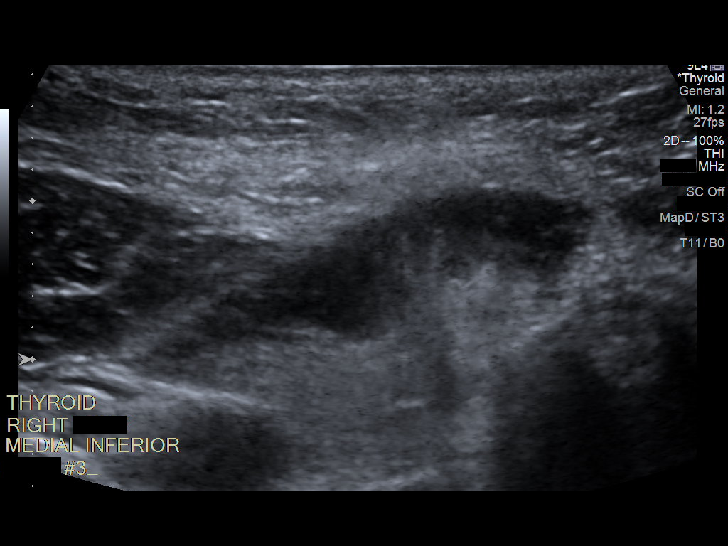
[im 14/23]
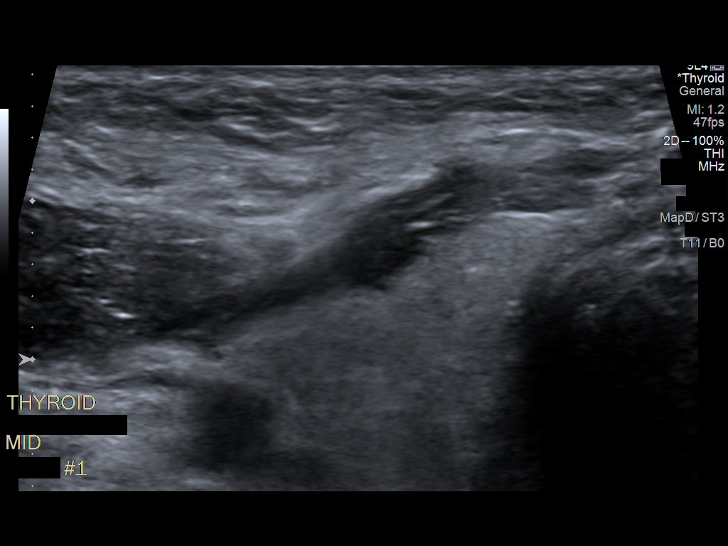
[im 16/23]
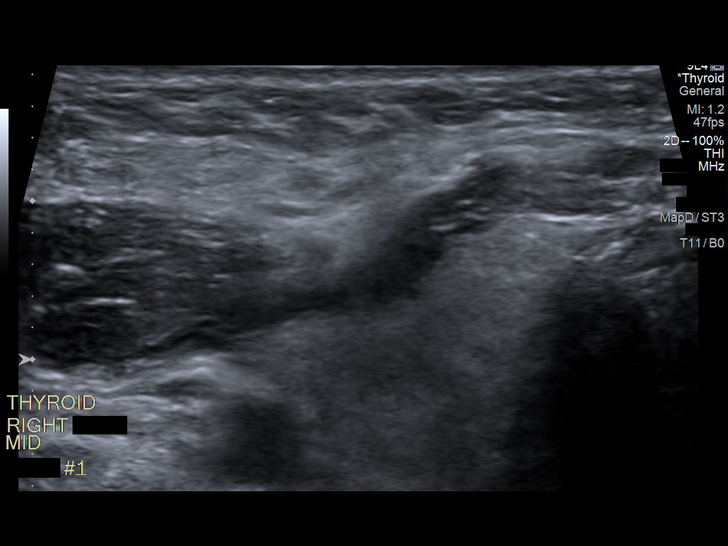
[im 17/23]
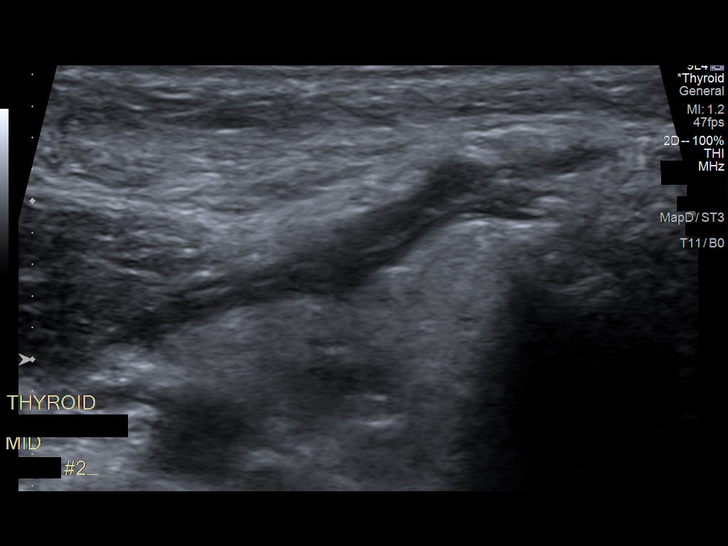
[im 19/23]
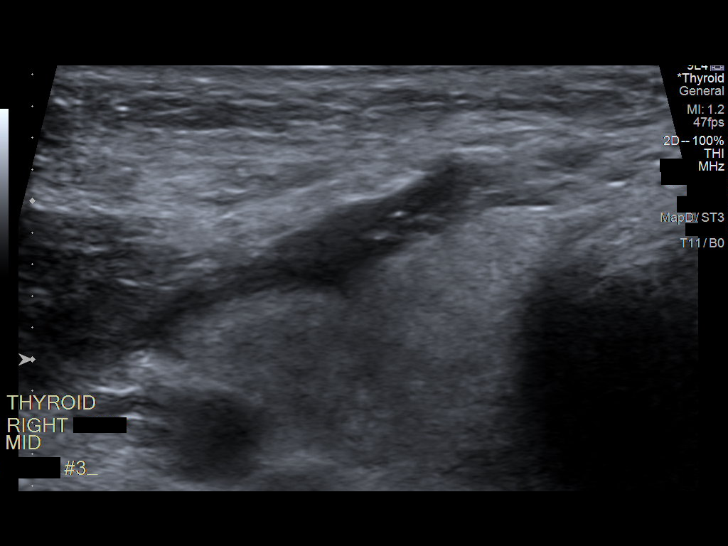
[im 21/23]
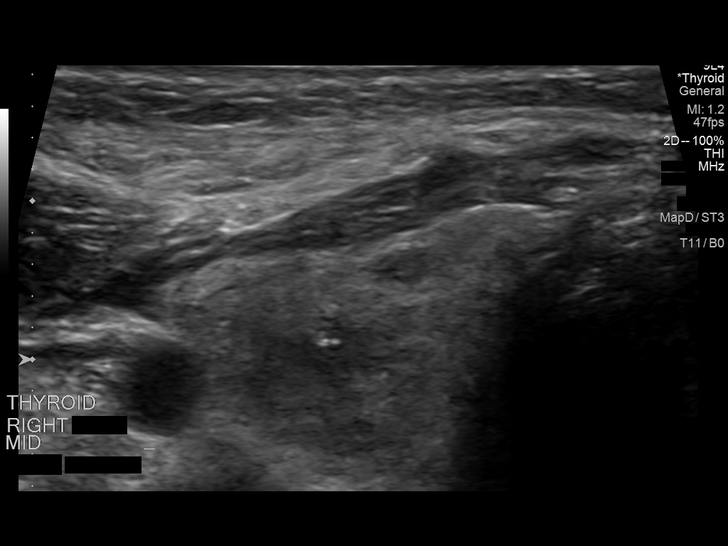
[im 23/23]
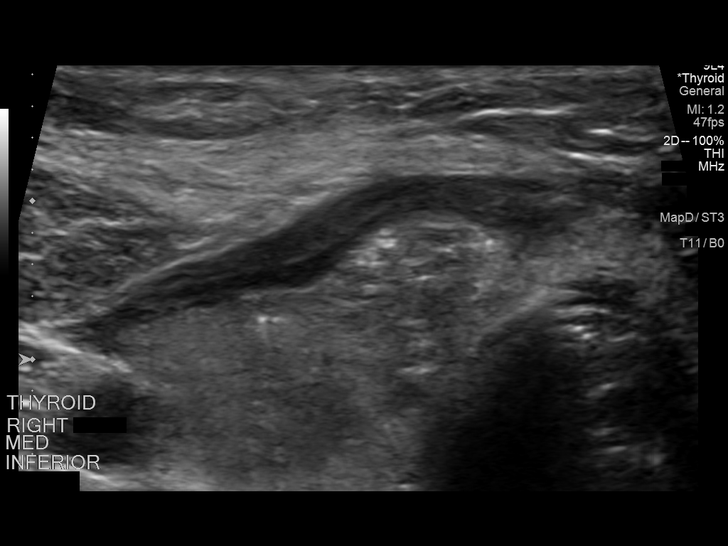

[13 of 23 positions shown; findings below may reference images not displayed]

Pre-procedural ultrasound scanning demonstrated unchanged size and
appearance of the indeterminate nodules within the right mid and
inferior lobe of the thyroid.

The procedure was planned. The neck was prepped in the usual sterile
fashion, and a sterile drape was applied covering the operative
field. A timeout was performed prior to the initiation of the
procedure. Local anesthesia was provided with 1% lidocaine.

Under direct ultrasound guidance, 3 FNA biopsies were performed of
the right inferior nodule with a 25 gauge needle. Multiple
ultrasound images were saved for procedural documentation purposes.
The samples were prepared and submitted to pathology.

Under direct ultrasound guidance, 3 FNA biopsies were performed of
the right mid nodule with a 25 gauge needle. Multiple ultrasound
images were saved for procedural documentation purposes. The samples
were prepared and submitted to pathology.

Limited post procedural scanning was negative for hematoma or
additional complication. Dressings were placed. The patient
tolerated the above procedures procedure well without immediate
postprocedural complication.
FINDINGS: FINDINGS
Nodule reference number based on prior diagnostic ultrasound: 3

Maximum size: 1.5 cm

Location: Right; Inferior

ACR TI-RADS risk category: TR5 (>/= 7 points)

Reason for biopsy: meets ACR TI-RADS criteria

_________________________________________________________

Nodule reference number based on prior diagnostic ultrasound: 2

Maximum size: 1.4 cm

Location: Right; Mid

ACR TI-RADS risk category: TR5 (>/= 7 points)

Reason for biopsy: meets ACR TI-RADS criteria

Ultrasound imaging confirms appropriate placement of the needles
within the thyroid nodule.
IMPRESSION: 1. Technically successful ultrasound guided fine needle aspiration
of nodule 3 within the right inferior aspect of the thyroid.
2. Technically successful ultrasound guided fine needle aspiration
of nodule 2 within the right mid aspect of the thyroid.

## 2017-12-14 IMAGING — CR DG CHEST 2V
2 series · 2 of 2 positions shown · non-contrast
Comparison: None.

CLINICAL DATA: Preoperative chest radiograph prior to
thyroidectomy.

EXAM:
CHEST  2 VIEW

[w chest pa]
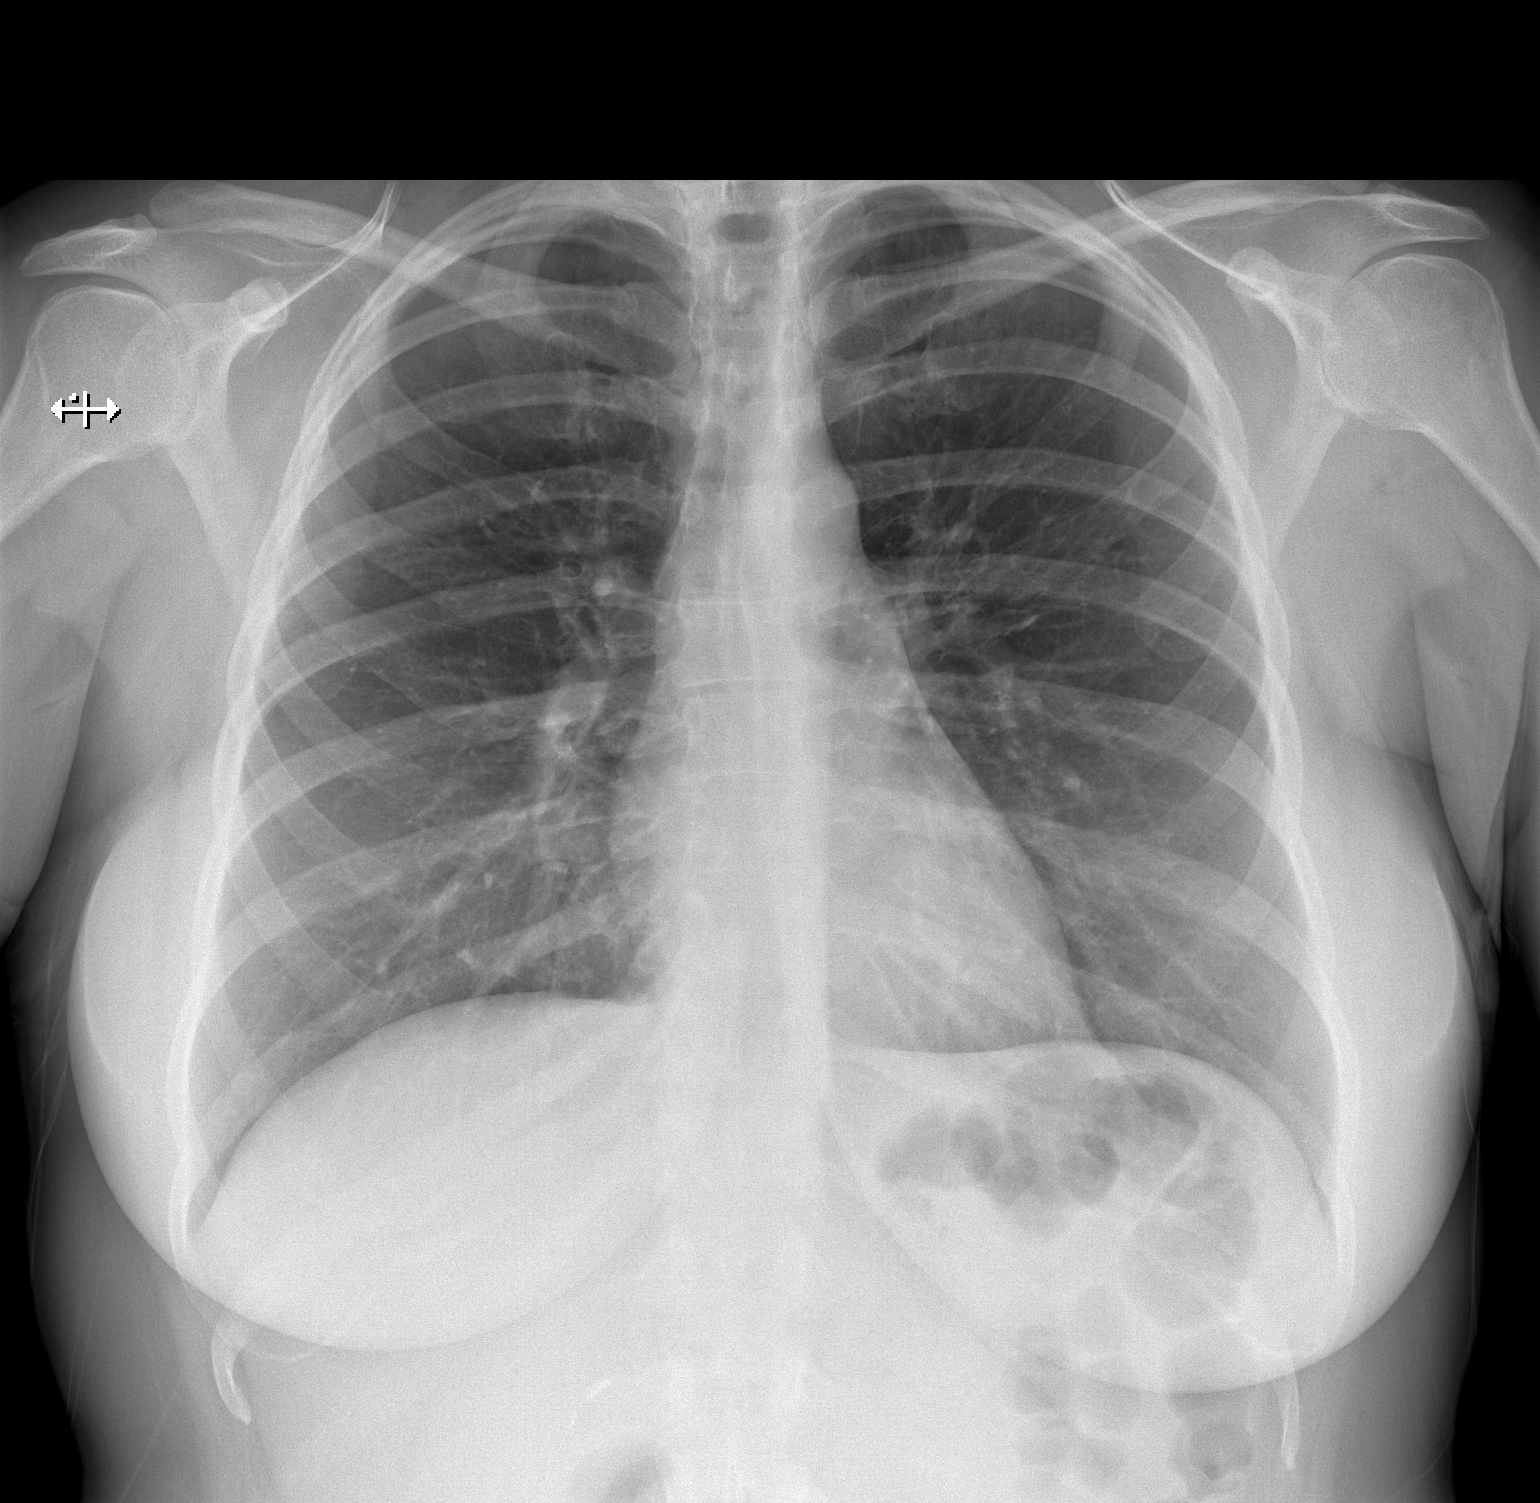

[w chest lat]
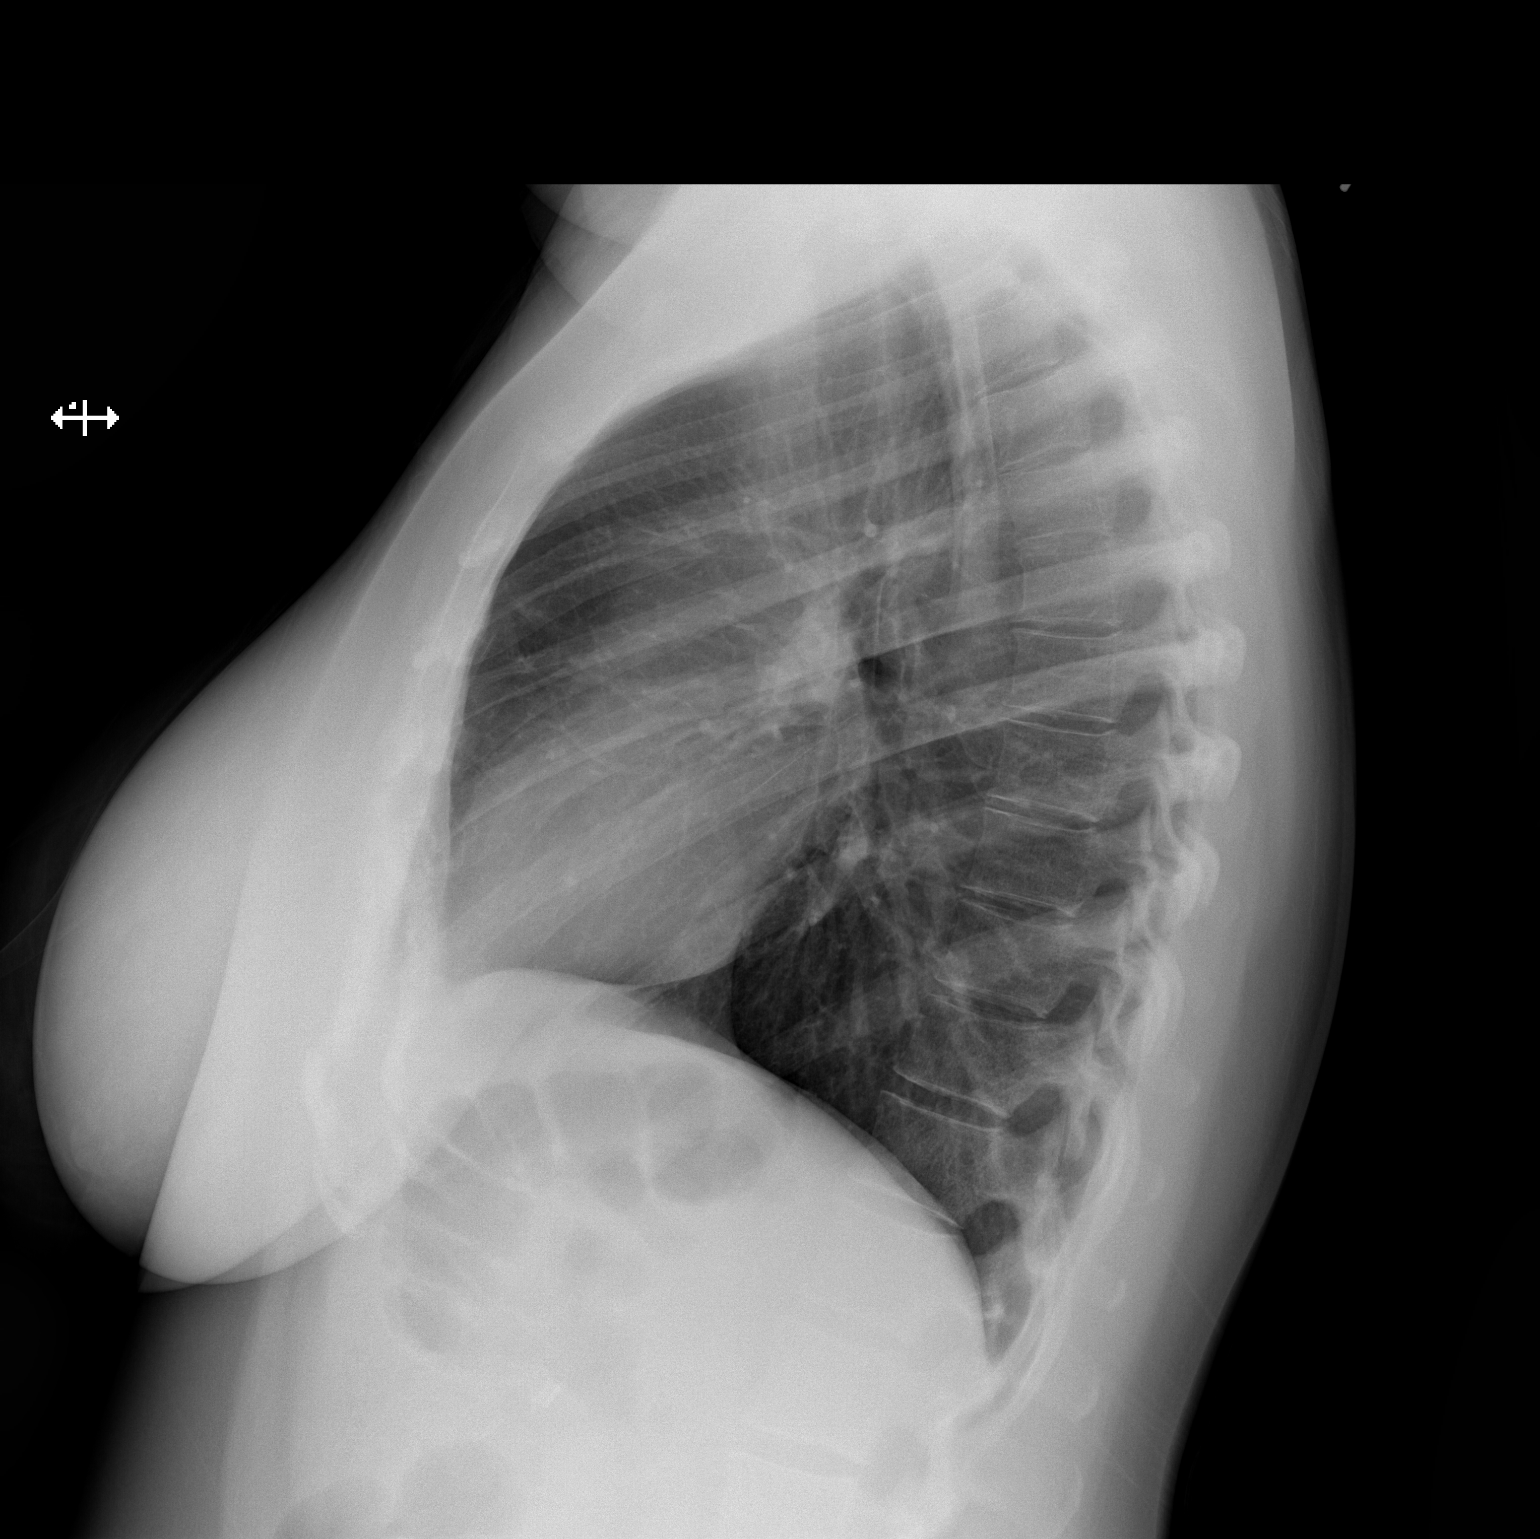

[2 of 2 positions shown; findings below may reference images not displayed]

FINDINGS: The cardiomediastinal silhouette is unremarkable.

There is no evidence of focal airspace disease, pulmonary edema,
suspicious pulmonary nodule/mass, pleural effusion, or pneumothorax.
No acute bony abnormalities are identified.
IMPRESSION: No active cardiopulmonary disease.

## 2018-01-03 DIAGNOSIS — E890 Postprocedural hypothyroidism: Secondary | ICD-10-CM | POA: Diagnosis not present

## 2018-01-03 DIAGNOSIS — C73 Malignant neoplasm of thyroid gland: Secondary | ICD-10-CM | POA: Diagnosis not present

## 2018-02-09 ENCOUNTER — Other Ambulatory Visit: Payer: Self-pay | Admitting: Family Medicine

## 2018-02-13 DIAGNOSIS — E890 Postprocedural hypothyroidism: Secondary | ICD-10-CM | POA: Diagnosis not present

## 2018-02-13 DIAGNOSIS — C73 Malignant neoplasm of thyroid gland: Secondary | ICD-10-CM | POA: Diagnosis not present

## 2018-03-10 ENCOUNTER — Other Ambulatory Visit: Payer: Self-pay | Admitting: Family Medicine

## 2018-03-15 DIAGNOSIS — J019 Acute sinusitis, unspecified: Secondary | ICD-10-CM | POA: Diagnosis not present

## 2018-07-04 DIAGNOSIS — C73 Malignant neoplasm of thyroid gland: Secondary | ICD-10-CM | POA: Diagnosis not present

## 2018-07-04 DIAGNOSIS — E890 Postprocedural hypothyroidism: Secondary | ICD-10-CM | POA: Diagnosis not present

## 2018-07-04 DIAGNOSIS — R5383 Other fatigue: Secondary | ICD-10-CM | POA: Diagnosis not present

## 2018-08-17 DIAGNOSIS — E890 Postprocedural hypothyroidism: Secondary | ICD-10-CM | POA: Diagnosis not present

## 2018-08-17 DIAGNOSIS — G4730 Sleep apnea, unspecified: Secondary | ICD-10-CM | POA: Diagnosis not present

## 2018-12-12 IMAGING — US US SOFT TISSUE HEAD/NECK
1 series · 12 of 25 positions shown · non-contrast
Comparison: 06/26/2009

CLINICAL DATA: Prior ultrasound follow-up. Follow-up left thyroid
nodules.

EXAM:
THYROID ULTRASOUND
TECHNIQUE: Ultrasound examination of the thyroid gland and adjacent soft
tissues was performed.

[Series 1: us soft tissue head/neck · 0.05mm/px · 12 of 48 slices shown]
[im 2/48]
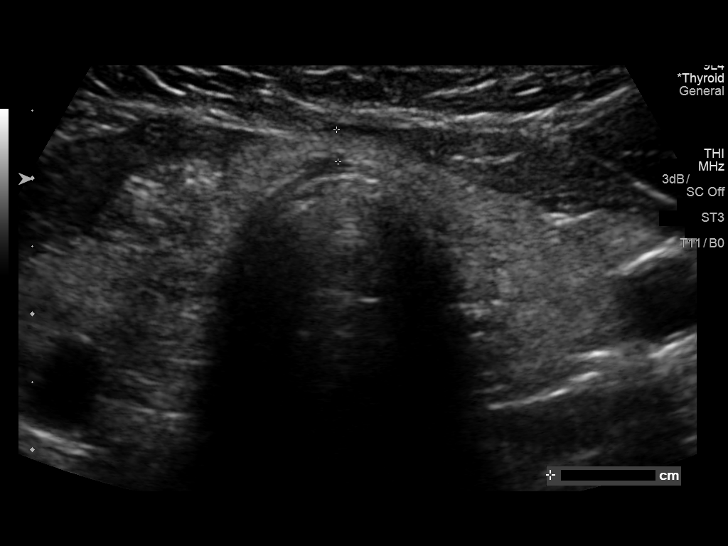
[im 6/48]
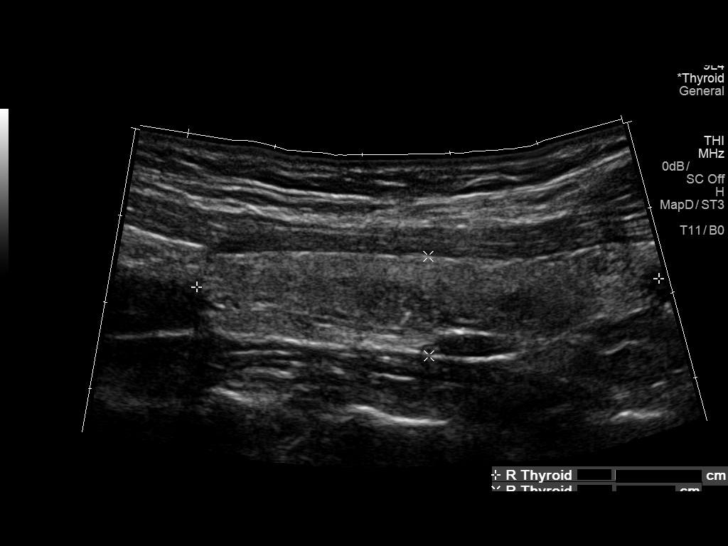
[im 10/48]
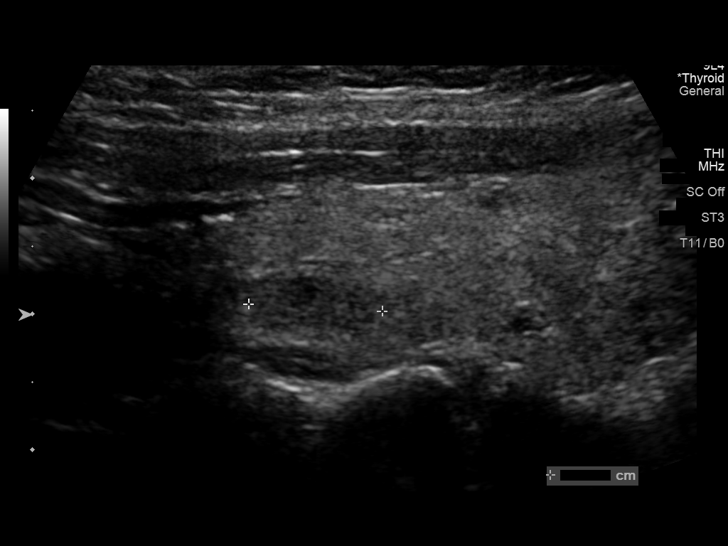
[im 14/48]
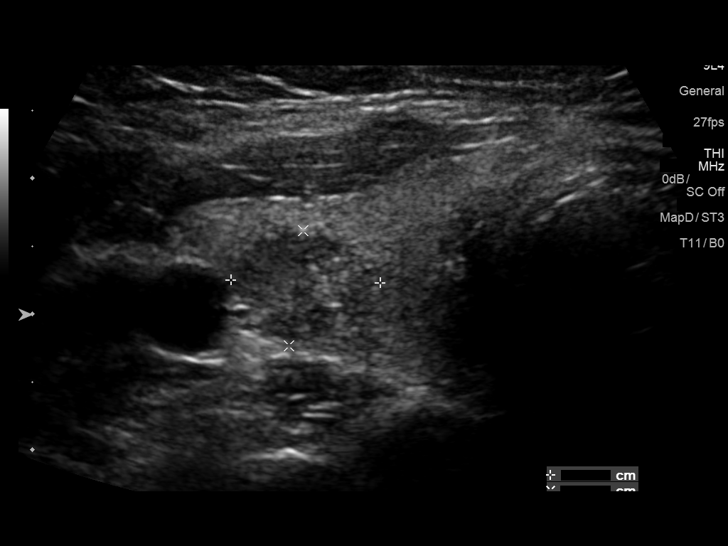
[im 18/48]
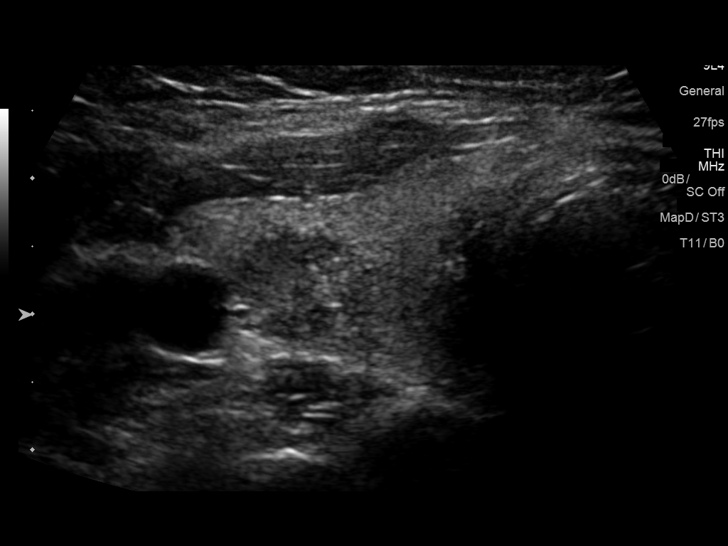
[im 22/48]
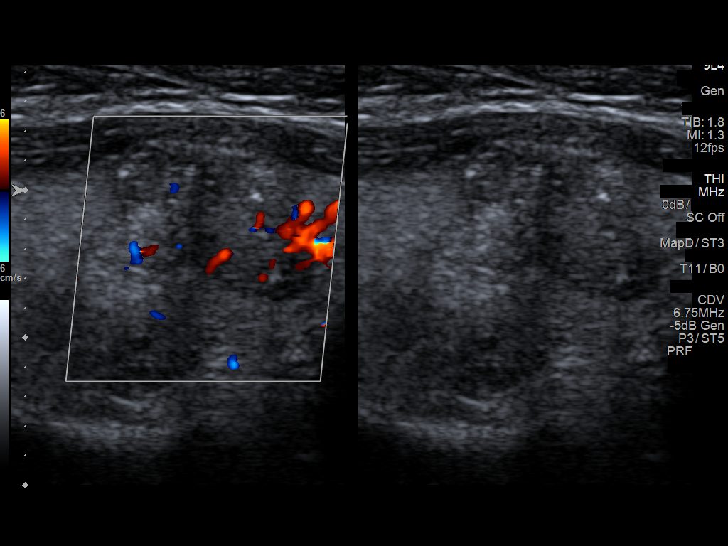
[im 26/48]
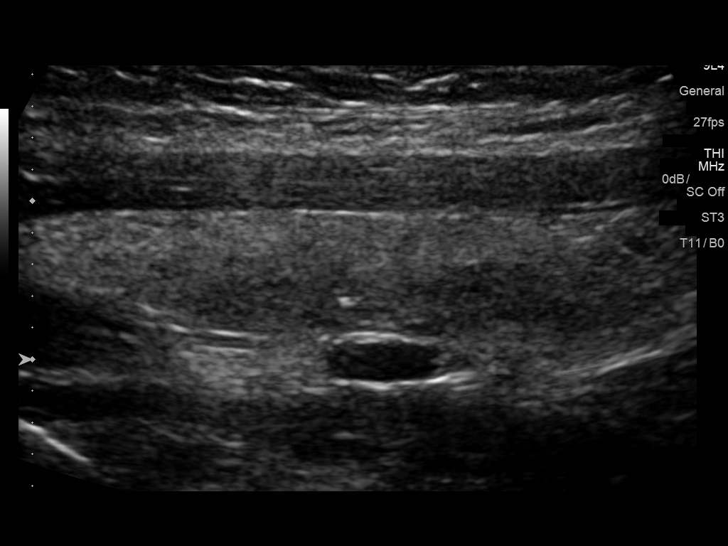
[im 30/48]
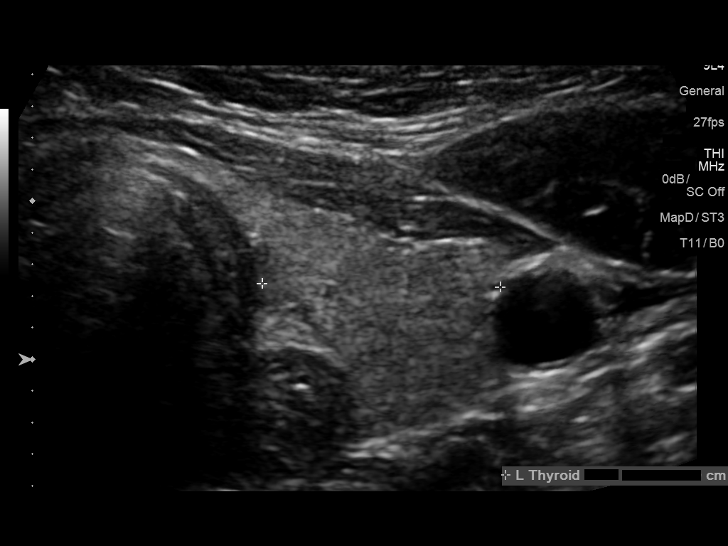
[im 34/48]
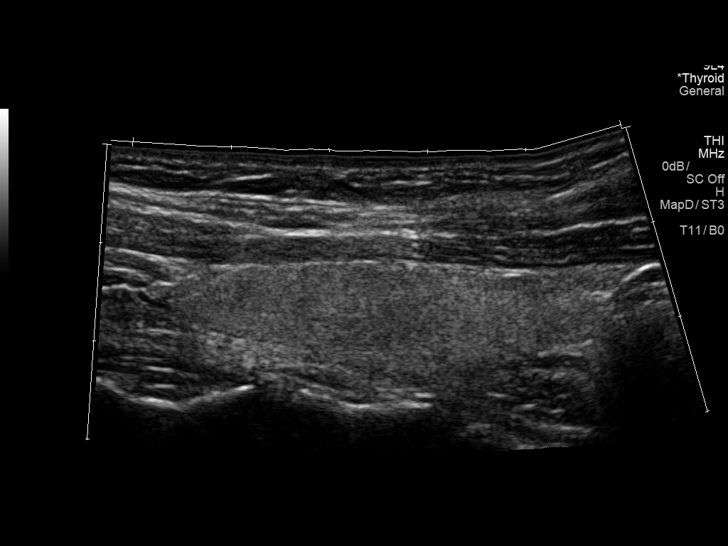
[im 38/48]
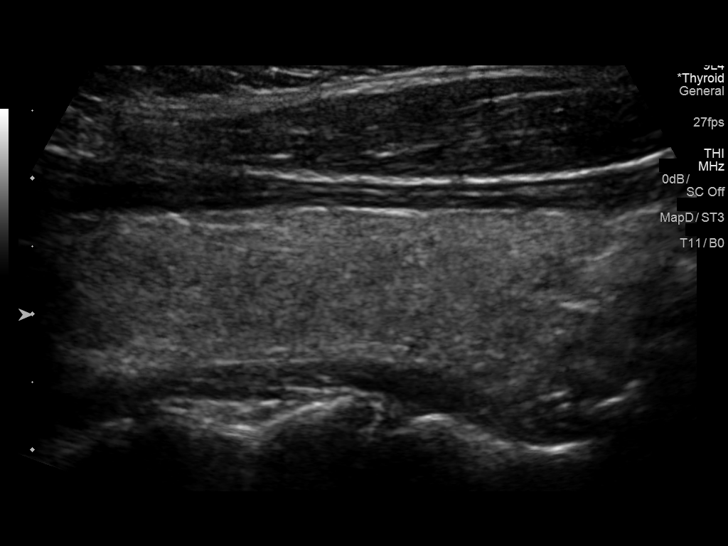
[im 42/48]
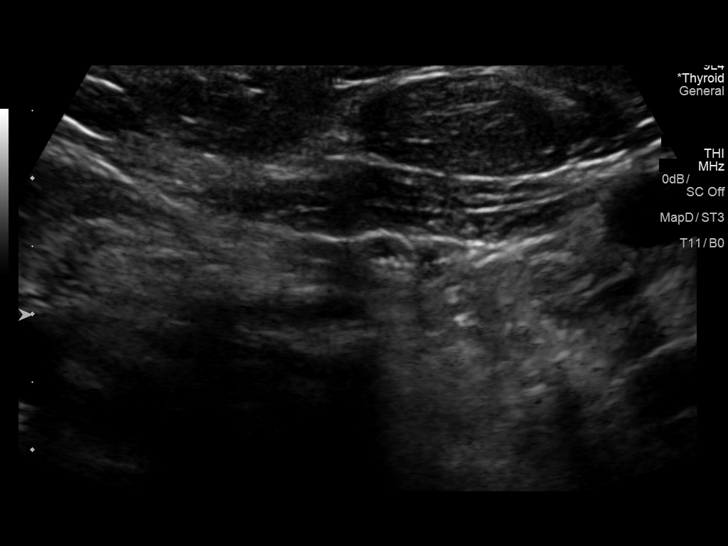
[im 46/48]
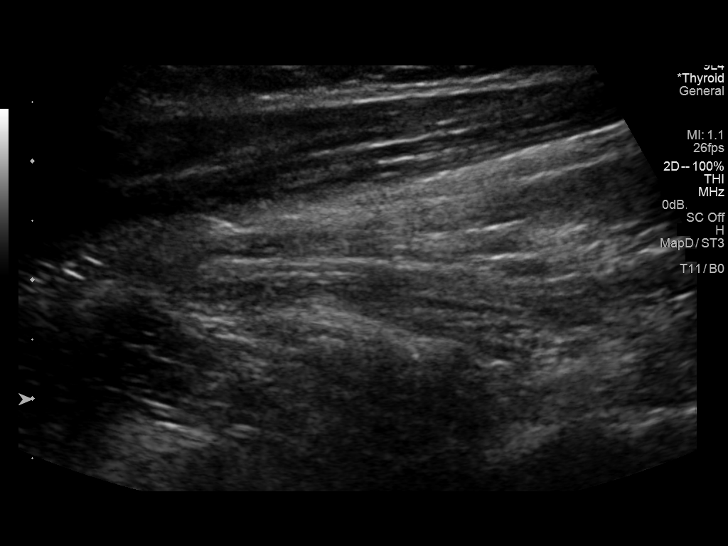

[12 of 25 positions shown; findings below may reference images not displayed]

FINDINGS: Parenchymal Echotexture: Mildly heterogenous

Isthmus: Measures 0.2 cm in the AP dimension.

Right lobe: 5.3 x 1.1 x 2.2 cm.

Left lobe: 4.6 x 1.2 x 1.5 cm

_________________________________________________________

Estimated total number of nodules >/= 1 cm: 3

Number of spongiform nodules >/=  2 cm not described below (TR1): 0

Number of mixed cystic and solid nodules >/= 1.5 cm not described
below (TR2): 0

_________________________________________________________

Nodule # 1:

Prior biopsy: No

Location: Right; Superior

Maximum size: 1.0 cm; Other 2 dimensions: 0.5 x 0.7 cm, previously,
0.6 x 0.4 x 0.4 cm

Composition: solid/almost completely solid (2)

Echogenicity: hypoechoic (2)

Shape: not taller-than-wide (0)

Margins: ill-defined (0)

Echogenic foci: none (0)

ACR TI-RADS total points: 4.

ACR TI-RADS risk category:  TR4 (4-6 points).

Significant change in size (>/= 20% in two dimensions and minimal
increase of 2 mm): Yes

Change in features: No

Change in ACR TI-RADS risk category: No

ACR TI-RADS recommendations:

*Given size (>/= 1 - 1.4 cm) and appearance, a follow-up ultrasound
in 1 year should be considered based on TI-RADS criteria.

_________________________________________________________

Nodule # 2:

Prior biopsy: No

Location: Right; Mid

Maximum size: 1.4 cm; Other 2 dimensions: 0.9 x 1.1 cm. Previously,
there was a nodule in the mid right thyroid lobe that measured up to
0.4 cm but not clear if this represents the same nodule.

Composition: solid/almost completely solid (2)

Echogenicity: hypoechoic (2)

Shape: not taller-than-wide (0)

Margins: lobulated/irregular (2)

Echogenic foci: punctate echogenic foci (3)

ACR TI-RADS total points: 9.

ACR TI-RADS risk category:  TR5 (>/= 7 points).

ACR TI-RADS recommendations:

**Given size (>/= 1.0 cm) and appearance, fine needle aspiration of
this highly suspicious nodule should be considered based on TI-RADS
criteria.

_________________________________________________________

Nodule # 3:

Prior biopsy: No

Location: Right; Inferior

Maximum size: 1.5 cm; Other 2 dimensions: 1.0 x 0.9 cm, previously,
0.9 x 0.6 x 0.6 cm

Composition: solid/almost completely solid (2)

Echogenicity: hypoechoic (2)

Shape: taller-than-wide (3)

Margins: ill-defined (0)

Echogenic foci: punctate echogenic foci (3)

ACR TI-RADS total points: 10.

ACR TI-RADS risk category:  TR5 (>/= 7 points).

Significant change in size (>/= 20% in two dimensions and minimal
increase of 2 mm): Yes

Change in features: Yes

Change in ACR TI-RADS risk category: Yes

ACR TI-RADS recommendations:

**Given size (>/= 1.0 cm) and appearance, fine needle aspiration of
this highly suspicious nodule should be considered based on TI-RADS
criteria.

_________________________________________________________

No discrete left thyroid nodules. No significant lymphadenopathy
identified.
IMPRESSION: Enlargement of the right thyroid nodules.

Nodules in the mid and inferior right thyroid lobe (Nodules #2 and
#3) are highly suspicious and recommend ultrasound-guided biopsy of
both nodules.

Recommend 1 year follow-up of the right superior thyroid nodule
(Nodule #1).

The above is in keeping with the ACR TI-RADS recommendations - [HOSPITAL] 7000;[DATE].

## 2019-03-12 DIAGNOSIS — E890 Postprocedural hypothyroidism: Secondary | ICD-10-CM | POA: Diagnosis not present

## 2019-03-12 DIAGNOSIS — C73 Malignant neoplasm of thyroid gland: Secondary | ICD-10-CM | POA: Diagnosis not present

## 2019-03-12 DIAGNOSIS — E559 Vitamin D deficiency, unspecified: Secondary | ICD-10-CM | POA: Diagnosis not present

## 2019-04-23 ENCOUNTER — Encounter: Payer: Self-pay | Admitting: Family Medicine

## 2019-04-25 ENCOUNTER — Encounter: Payer: Self-pay | Admitting: Physician Assistant

## 2019-04-25 ENCOUNTER — Ambulatory Visit (INDEPENDENT_AMBULATORY_CARE_PROVIDER_SITE_OTHER): Payer: 59 | Admitting: Physician Assistant

## 2019-04-25 ENCOUNTER — Other Ambulatory Visit: Payer: Self-pay

## 2019-04-25 VITALS — BP 112/80 | HR 76 | Temp 98.2°F | Resp 16 | Ht 66.0 in | Wt 203.0 lb

## 2019-04-25 DIAGNOSIS — R300 Dysuria: Secondary | ICD-10-CM

## 2019-04-25 LAB — POCT URINALYSIS DIPSTICK
Bilirubin, UA: NEGATIVE
Blood, UA: POSITIVE
Glucose, UA: NEGATIVE
Ketones, UA: NEGATIVE
Leukocytes, UA: NEGATIVE
Nitrite, UA: NEGATIVE
Protein, UA: NEGATIVE
Spec Grav, UA: 1.015 (ref 1.010–1.025)
Urobilinogen, UA: 0.2 E.U./dL
pH, UA: 6 (ref 5.0–8.0)

## 2019-04-25 MED ORDER — FLUCONAZOLE 150 MG PO TABS: 150.0000 mg | ORAL_TABLET | Freq: Once | ORAL | 0 refills | Status: AC

## 2019-04-25 MED ORDER — ONDANSETRON HCL 4 MG PO TABS: 4.0000 mg | ORAL_TABLET | Freq: Three times a day (TID) | ORAL | 0 refills | Status: DC | PRN

## 2019-04-25 MED ORDER — CIPROFLOXACIN HCL 500 MG PO TABS: 500.0000 mg | ORAL_TABLET | Freq: Two times a day (BID) | ORAL | 0 refills | Status: DC

## 2019-04-25 NOTE — Progress Notes (Signed)
Patient presents to clinic today c/o continued dysuria, urinary urgency and frequency that started Monday. Denies fever, chills. Notes nausea without vomiting. Notes urinary hesitancy, suprapubic pressure and low back pain. Was seen by Tele-Doc through work Monday and started on 3 days course of Bactrim which she notes has helped but only slightly. Denies vaginal symptoms or concern for STI.   Past Medical History:  Diagnosis Date  . Anemia    hx  . Complication of anesthesia   . Family history of adverse reaction to anesthesia    mom nausea and vomiting  . GERD (gastroesophageal reflux disease)   . Hay fever   . History of chicken pox   . Hypertension   . IBS (irritable bowel syndrome)   . Migraine    hx  . PONV (postoperative nausea and vomiting)     Current Outpatient Medications on File Prior to Visit  Medication Sig Dispense Refill  . levothyroxine (SYNTHROID) 150 MCG tablet Take 1 po six days a week and 1/2 on Sundays    . Multiple Vitamin (MULTIVITAMIN WITH MINERALS) TABS tablet Take 1 tablet by mouth every evening.    . valACYclovir (VALTREX) 1000 MG tablet 2 tabs q12 x2 doses at first sign of cold sores (Patient not taking: Reported on 04/25/2019) 32 tablet 1   Current Facility-Administered Medications on File Prior to Visit  Medication Dose Route Frequency Provider Last Rate Last Dose  . TDaP (BOOSTRIX) injection 0.5 mL  0.5 mL Intramuscular Once Midge Minium, MD        Allergies  Allergen Reactions  . 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 Arthritis Paternal Grandmother   . Other Paternal Grandmother        hyper or hypo glycemia  . Colon cancer Paternal Grandfather   . Lung cancer Maternal Grandfather   . Skin cancer Maternal Grandfather   . Hyperlipidemia Father     Social History   Socioeconomic History  . Marital status: Married    Spouse name: Not on file  . Number of children: Not on file  .  Years of education: Not on file  . Highest education level: Not on file  Occupational History  . Not on file  Social Needs  . Financial resource strain: Not on file  . Food insecurity    Worry: Not on file    Inability: Not on file  . Transportation needs    Medical: Not on file    Non-medical: Not on file  Tobacco Use  . Smoking status: Never Smoker  . Smokeless tobacco: Never Used  Substance and Sexual Activity  . Alcohol use: Yes    Comment: rare  . Drug use: No  . Sexual activity: Yes    Birth control/protection: None  Lifestyle  . Physical activity    Days per week: Not on file    Minutes per session: Not on file  . Stress: Not on file  Relationships  . Social Herbalist on phone: Not on file    Gets together: Not on file    Attends religious service: Not on file    Active member of club or organization: Not on file    Attends meetings of clubs or organizations: Not on file    Relationship status: Not on file  Other Topics Concern  . Not on file  Social History Narrative  . Not on file   Review  of Systems - See HPI.  All other ROS are negative.  BP 112/80   Pulse 76   Temp 98.2 F (36.8 C) (Skin)   Resp 16   Ht 5\' 6"  (1.676 m)   Wt 203 lb (92.1 kg)   SpO2 99%   BMI 32.77 kg/m   Physical Exam Vitals signs reviewed.  Constitutional:      Appearance: Normal appearance.  HENT:     Head: Normocephalic and atraumatic.     Right Ear: Tympanic membrane normal.     Left Ear: Tympanic membrane normal.     Nose: Nose normal.     Mouth/Throat:     Mouth: Mucous membranes are moist.  Neck:     Musculoskeletal: Neck supple.  Cardiovascular:     Rate and Rhythm: Normal rate and regular rhythm.  Pulmonary:     Effort: Pulmonary effort is normal.  Abdominal:     General: Bowel sounds are normal.     Palpations: Abdomen is soft.     Tenderness: There is abdominal tenderness (suprapubic only). There is no right CVA tenderness or left CVA tenderness.   Neurological:     Mental Status: She is alert.    Recent Results (from the past 2160 hour(s))  POCT Urinalysis Dipstick     Status: Abnormal   Collection Time: 04/25/19  8:39 AM  Result Value Ref Range   Color, UA yellow    Clarity, UA clear    Glucose, UA Negative Negative   Bilirubin, UA negative    Ketones, UA negative    Spec Grav, UA 1.015 1.010 - 1.025   Blood, UA positive    pH, UA 6.0 5.0 - 8.0   Protein, UA Negative Negative   Urobilinogen, UA 0.2 0.2 or 1.0 E.U./dL   Nitrite, UA negative    Leukocytes, UA Negative Negative   Appearance     Odor      Assessment/Plan: 1. Dysuria Urine dip unremarkable but she just finished 3 days of Bactrim. Will send for culture. Start Cipro BID x 5 days while culture results are pending. Rx Zofran sent in for nausea. Supportive measures reviewed.  - POCT Urinalysis Dipstick - Urine Culture - ciprofloxacin (CIPRO) 500 MG tablet; Take 1 tablet (500 mg total) by mouth 2 (two) times daily.  Dispense: 10 tablet; Refill: 0    Leeanne Rio, PA-C

## 2019-04-25 NOTE — Patient Instructions (Signed)
Your symptoms are consistent with a bladder infection, also called acute cystitis. Please take your antibiotic (Cipro) as directed until all pills are gone.  Stay very well hydrated.  Consider a daily probiotic (Align, Culturelle, or Activia) to help prevent stomach upset caused by the antibiotic.  Taking a probiotic daily may also help prevent recurrent UTIs.  Also consider taking AZO (Phenazopyridine) tablets to help decrease pain with urination.  I will call you with your urine testing results.  We will change antibiotics if indicated.  Call or return to clinic if symptoms are not resolved by completion of antibiotic.   The diflucan is to be used as directed if needed for yeast.  Urinary Tract Infection A urinary tract infection (UTI) can occur any place along the urinary tract. The tract includes the kidneys, ureters, bladder, and urethra. A type of germ called bacteria often causes a UTI. UTIs are often helped with antibiotic medicine.  HOME CARE   If given, take antibiotics as told by your doctor. Finish them even if you start to feel better.  Drink enough fluids to keep your pee (urine) clear or pale yellow.  Avoid tea, drinks with caffeine, and bubbly (carbonated) drinks.  Pee often. Avoid holding your pee in for a long time.  Pee before and after having sex (intercourse).  Wipe from front to back after you poop (bowel movement) if you are a woman. Use each tissue only once. GET HELP RIGHT AWAY IF:   You have back pain.  You have lower belly (abdominal) pain.  You have chills.  You feel sick to your stomach (nauseous).  You throw up (vomit).  Your burning or discomfort with peeing does not go away.  You have a fever.  Your symptoms are not better in 3 days. MAKE SURE YOU:   Understand these instructions.  Will watch your condition.  Will get help right away if you are not doing well or get worse. Document Released: 03/21/2008 Document Revised: 06/27/2012 Document  Reviewed: 05/03/2012 [facility] Patient Information 2015 [place], [place]. This information is not intended to replace advice given to you by your health care provider. Make sure you discuss any questions you have with your health care provider.

## 2019-04-27 ENCOUNTER — Encounter: Payer: Self-pay | Admitting: Physician Assistant

## 2019-04-27 LAB — URINE CULTURE
MICRO NUMBER:: 651536
Result:: NO GROWTH
SPECIMEN QUALITY:: ADEQUATE

## 2019-05-29 ENCOUNTER — Other Ambulatory Visit: Payer: Self-pay

## 2019-05-29 ENCOUNTER — Encounter: Payer: Self-pay | Admitting: Family Medicine

## 2019-05-29 ENCOUNTER — Ambulatory Visit (INDEPENDENT_AMBULATORY_CARE_PROVIDER_SITE_OTHER): Payer: 59 | Admitting: Family Medicine

## 2019-05-29 VITALS — BP 120/82 | HR 74 | Temp 98.5°F | Resp 16 | Ht 66.0 in | Wt 197.1 lb

## 2019-05-29 DIAGNOSIS — E559 Vitamin D deficiency, unspecified: Secondary | ICD-10-CM | POA: Insufficient documentation

## 2019-05-29 DIAGNOSIS — E669 Obesity, unspecified: Secondary | ICD-10-CM | POA: Diagnosis not present

## 2019-05-29 DIAGNOSIS — Z0000 Encounter for general adult medical examination without abnormal findings: Secondary | ICD-10-CM | POA: Diagnosis not present

## 2019-05-29 LAB — CBC WITH DIFFERENTIAL/PLATELET
Basophils Absolute: 0 10*3/uL (ref 0.0–0.1)
Basophils Relative: 0.2 % (ref 0.0–3.0)
Eosinophils Absolute: 0.1 10*3/uL (ref 0.0–0.7)
Eosinophils Relative: 1 % (ref 0.0–5.0)
HCT: 40.1 % (ref 36.0–46.0)
Hemoglobin: 13.3 g/dL (ref 12.0–15.0)
Lymphocytes Relative: 18.4 % (ref 12.0–46.0)
Lymphs Abs: 1.5 10*3/uL (ref 0.7–4.0)
MCHC: 33.1 g/dL (ref 30.0–36.0)
MCV: 89.4 fl (ref 78.0–100.0)
Monocytes Absolute: 0.8 10*3/uL (ref 0.1–1.0)
Monocytes Relative: 9.6 % (ref 3.0–12.0)
Neutro Abs: 5.8 10*3/uL (ref 1.4–7.7)
Neutrophils Relative %: 70.8 % (ref 43.0–77.0)
Platelets: 349 10*3/uL (ref 150.0–400.0)
RBC: 4.48 Mil/uL (ref 3.87–5.11)
RDW: 13.1 % (ref 11.5–15.5)
WBC: 8.1 10*3/uL (ref 4.0–10.5)

## 2019-05-29 LAB — BASIC METABOLIC PANEL
BUN: 10 mg/dL (ref 6–23)
CO2: 25 mEq/L (ref 19–32)
Calcium: 9.2 mg/dL (ref 8.4–10.5)
Chloride: 104 mEq/L (ref 96–112)
Creatinine, Ser: 0.79 mg/dL (ref 0.40–1.20)
GFR: 82.77 mL/min (ref >60.00)
Glucose, Bld: 89 mg/dL (ref 70–99)
Potassium: 4.1 mEq/L (ref 3.5–5.1)
Sodium: 138 mEq/L (ref 135–145)

## 2019-05-29 LAB — HEPATIC FUNCTION PANEL
ALT: 16 U/L (ref 0–35)
AST: 14 U/L (ref 0–37)
Albumin: 4.3 g/dL (ref 3.5–5.2)
Alkaline Phosphatase: 68 U/L (ref 39–117)
Bilirubin, Direct: 0.1 mg/dL (ref 0.0–0.3)
Total Bilirubin: 0.3 mg/dL (ref 0.2–1.2)
Total Protein: 7.1 g/dL (ref 6.0–8.3)

## 2019-05-29 LAB — LIPID PANEL
Cholesterol: 141 mg/dL (ref 0–200)
HDL: 40.2 mg/dL (ref >39.00)
LDL Cholesterol: 81 mg/dL (ref 0–99)
NonHDL: 101.19
Total CHOL/HDL Ratio: 4
Triglycerides: 102 mg/dL (ref 0.0–149.0)
VLDL: 20.4 mg/dL (ref 0.0–40.0)

## 2019-05-29 LAB — VITAMIN D 25 HYDROXY (VIT D DEFICIENCY, FRACTURES): VITD: 29.34 ng/mL — ABNORMAL LOW (ref 30.00–100.00)

## 2019-05-29 LAB — TSH: TSH: 0.09 u[IU]/mL — ABNORMAL LOW (ref 0.35–4.50)

## 2019-05-29 NOTE — Patient Instructions (Signed)
Follow up in 1 year or as needed We'll notify you of your lab results and make any changes if needed Continue to work on healthy diet and regular exercise- you look great! Call with any questions or concerns Stay Safe!!! 

## 2019-05-29 NOTE — Assessment & Plan Note (Signed)
Pt has hx of this.  Check labs and replete prn. 

## 2019-05-29 NOTE — Assessment & Plan Note (Signed)
Pt is down 5 lbs from last visit.  Applauded her efforts at diet and exercise and encouraged her to continue.  Will follow.

## 2019-05-29 NOTE — Assessment & Plan Note (Signed)
Pt's PE WNL w/ exception of obesity.  UTD on GYN, immunizations.  Check labs.  Anticipatory guidance provided.  °

## 2019-05-29 NOTE — Progress Notes (Signed)
   Subjective:    Patient ID: Natasha Fernandez, female    DOB: Feb 05, 1986, 33 y.o.   MRN: [id number]  HPI CPE- UTD on GYN.  Is down 5 lbs since last visit.  UTD on immunizations.   Review of Systems Patient reports no vision/ hearing changes, adenopathy,fever, weight change, chest pain, palpitations, edema, persistant/recurrent cough, hemoptysis, dyspnea (rest/exertional/paroxysmal nocturnal), gastrointestinal bleeding (melena, rectal bleeding), abdominal pain, significant heartburn, bowel changes, GU symptoms (dysuria, hematuria, incontinence), Gyn symptoms (abnormal  bleeding, pain),  syncope, focal weakness, memory loss, numbness & tingling, skin/hair/nail changes, abnormal bruising or bleeding, anxiety, or depression.   + hoarseness- known vocal cord dysfxn + dysphagia- known vocal cord dysfxn    Objective:   Physical Exam General Appearance:    Alert, cooperative, no distress, appears stated age  Head:    Normocephalic, without obvious abnormality, atraumatic  Eyes:    PERRL, conjunctiva/corneas clear, EOM's intact, fundi    benign, both eyes  Ears:    Normal TM's and external ear canals, both ears  Nose:   Deferred due to COVID  Throat:   Neck:   Supple, symmetrical, trachea midline, no adenopathy;    Thyroid: no enlargement/tenderness/nodules  Back:     Symmetric, no curvature, ROM normal, no CVA tenderness  Lungs:     Clear to auscultation bilaterally, respirations unlabored  Chest Wall:    No tenderness or deformity   Heart:    Regular rate and rhythm, S1 and S2 normal, no murmur, rub   or gallop  Breast Exam:    Deferred to GYN  Abdomen:     Soft, non-tender, bowel sounds active all four quadrants,    no masses, no organomegaly  Genitalia:    Deferred to GYN  Rectal:    Extremities:   Extremities normal, atraumatic, no cyanosis or edema  Pulses:   2+ and symmetric all extremities  Skin:   Skin color, texture, turgor normal, no rashes or lesions  Lymph nodes:   Cervical,  supraclavicular, and axillary nodes normal  Neurologic:   CNII-XII intact, normal strength, sensation and reflexes    throughout          Assessment & Plan:

## 2019-07-22 ENCOUNTER — Telehealth: Payer: 59 | Admitting: Nurse Practitioner

## 2019-07-22 DIAGNOSIS — R519 Headache, unspecified: Secondary | ICD-10-CM | POA: Diagnosis not present

## 2019-07-22 DIAGNOSIS — J029 Acute pharyngitis, unspecified: Secondary | ICD-10-CM | POA: Diagnosis not present

## 2019-07-22 DIAGNOSIS — R509 Fever, unspecified: Secondary | ICD-10-CM

## 2019-07-22 DIAGNOSIS — Z20828 Contact with and (suspected) exposure to other viral communicable diseases: Secondary | ICD-10-CM

## 2019-07-22 DIAGNOSIS — Z20822 Contact with and (suspected) exposure to covid-19: Secondary | ICD-10-CM

## 2019-07-22 NOTE — Progress Notes (Signed)
E-Visit for Corona Virus Screening   Your current symptoms could be consistent with the coronavirus.  Many health care providers can now test patients at their office but not all are.   has multiple testing sites. For information on our COVID testing locations and hours go to [email or web address]  Please quarantine yourself while awaiting your test results.  We are enrolling you in our [place] for [place] . Daily you will receive a questionnaire within the [place] website. Our COVID 19 response team willl be monitoriing your responses daily.  You can go to one of the  testing sites listed below, while they are opened (see hours). You do not need a doctors order to be tested for covid.You do need to self-isolate until your results return and if positive 14 days from when your symptoms started and until you are 3 days symptom free.   Testing Locations (Monday - Friday, 8 a.m. - 3:30 p.m.) . [place]: [facility], [address] [place], [place]: [place], [place], [place], [place] (entrance off [address])  . [place] [address], [place], [place] (across from [facility])    COVID-19 is a respiratory illness with symptoms that are similar to the flu. Symptoms are typically mild to moderate, but there have been cases of severe illness and death due to the virus. The following symptoms may appear 2-14 days after exposure: . Fever . Cough . Shortness of breath or difficulty breathing . Chills . Repeated shaking with chills . Muscle pain . Headache . Sore throat . New loss of taste or smell . Fatigue . Congestion or runny nose . Nausea or vomiting . Diarrhea  It is vitally important that if you feel that you have an infection such as this virus or any other virus that you stay home and away from places where you may  spread it to others.  You should self-quarantine for 14 days if you have symptoms that could potentially be coronavirus or have been in close contact a with a person diagnosed with COVID-19 within the last 2 weeks. You should avoid contact with people age 15 and older.   You should wear a mask or cloth face covering over your nose and mouth if you must be around other people or animals, including pets (even at home). Try to stay at least 6 feet away from other people. This will protect the people around you.  You can use medication such as mucinex OTC if cough develops  You may also take acetaminophen (Tylenol) as needed for fever.   Reduce your risk of any infection by using the same precautions used for avoiding the common cold or flu:  Mar[place] Kitchen Wash your hands often with soap and warm water for at least 20 seconds.  If soap and water are not readily available, use an alcohol-based hand sanitizer with at least 60% alcohol.  . If coughing or sneezing, cover your mouth and nose by coughing or sneezing into the elbow areas of your shirt or coat, into a tissue or into your sleeve (not your hands). . Avoid shaking hands with others and consider head nods or verbal greetings only. . Avoid touching your eyes, nose, or mouth with unwashed hands.  . Avoid close contact with people who are sick. . Avoid places or events with large numbers of people in one location, like concerts or sporting events. . Carefully  consider travel plans you have or are making. . If you are planning any travel outside or inside the [place], visit the CDC's Travelers' Health webpage for the latest health notices. . If you have some symptoms but not all symptoms, continue to monitor at home and seek medical attention if your symptoms worsen. . If you are having a medical emergency, call 911.  HOME CARE . Only take medications as instructed by your medical team. . Drink plenty of fluids and get plenty of rest. . A steam or ultrasonic  humidifier can help if you have congestion.   GET HELP RIGHT AWAY IF YOU HAVE EMERGENCY WARNING SIGNS** FOR COVID-19. If you or someone is showing any of these signs seek emergency medical care immediately. Call 911 or proceed to your closest emergency facility if: . You develop worsening high fever. . Trouble breathing . Bluish lips or face . Persistent pain or pressure in the chest . New confusion . Inability to wake or stay awake . You cough up blood. . Your symptoms become more severe  **This list is not all possible symptoms. Contact your medical provider for any symptoms that are sever or concerning to you.   MAKE SURE YOU   Understand these instructions.  Will watch your condition.  Will get help right away if you are not doing well or get worse.  Your e-visit answers were reviewed by a board certified advanced clinical practitioner to complete your personal care plan.  Depending on the condition, your plan could have included both over the counter or prescription medications.  If there is a problem please reply once you have received a response from your provider.  Your safety is important to [place].  If you have drug allergies check your prescription carefully.    You can use MyChart to ask questions about today's visit, request a non-urgent call back, or ask for a work or school excuse for 24 hours related to this e-Visit. If it has been greater than 24 hours you will need to follow up with your provider, or enter a new e-Visit to address those concerns. You will get an e-mail in the next two days asking about your experience.  I hope that your e-visit has been valuable and will speed your recovery. Thank you for using e-visits.   5-10 minutes spent reviewing and documenting in chart.

## 2019-10-07 ENCOUNTER — Ambulatory Visit: Payer: 59 | Attending: Internal Medicine

## 2019-10-07 DIAGNOSIS — Z20822 Contact with and (suspected) exposure to covid-19: Secondary | ICD-10-CM

## 2019-10-08 LAB — NOVEL CORONAVIRUS, NAA: SARS-CoV-2, NAA: NOT DETECTED

## 2019-10-22 DIAGNOSIS — C73 Malignant neoplasm of thyroid gland: Secondary | ICD-10-CM | POA: Diagnosis not present

## 2019-10-22 DIAGNOSIS — E890 Postprocedural hypothyroidism: Secondary | ICD-10-CM | POA: Diagnosis not present

## 2019-10-31 DIAGNOSIS — E890 Postprocedural hypothyroidism: Secondary | ICD-10-CM | POA: Diagnosis not present

## 2019-10-31 DIAGNOSIS — Z23 Encounter for immunization: Secondary | ICD-10-CM | POA: Diagnosis not present

## 2019-10-31 DIAGNOSIS — C73 Malignant neoplasm of thyroid gland: Secondary | ICD-10-CM | POA: Diagnosis not present

## 2020-01-09 DIAGNOSIS — Z319 Encounter for procreative management, unspecified: Secondary | ICD-10-CM | POA: Diagnosis not present

## 2020-01-22 DIAGNOSIS — Z3189 Encounter for other procreative management: Secondary | ICD-10-CM | POA: Diagnosis not present

## 2020-01-22 DIAGNOSIS — Z3141 Encounter for fertility testing: Secondary | ICD-10-CM | POA: Diagnosis not present

## 2020-01-27 DIAGNOSIS — Z3189 Encounter for other procreative management: Secondary | ICD-10-CM | POA: Diagnosis not present

## 2020-04-28 DIAGNOSIS — E890 Postprocedural hypothyroidism: Secondary | ICD-10-CM | POA: Diagnosis not present

## 2020-04-28 DIAGNOSIS — C73 Malignant neoplasm of thyroid gland: Secondary | ICD-10-CM | POA: Diagnosis not present

## 2020-04-29 DIAGNOSIS — L814 Other melanin hyperpigmentation: Secondary | ICD-10-CM | POA: Diagnosis not present

## 2020-04-29 DIAGNOSIS — L578 Other skin changes due to chronic exposure to nonionizing radiation: Secondary | ICD-10-CM | POA: Diagnosis not present

## 2020-04-29 DIAGNOSIS — D2272 Melanocytic nevi of left lower limb, including hip: Secondary | ICD-10-CM | POA: Diagnosis not present

## 2020-04-29 DIAGNOSIS — Z86018 Personal history of other benign neoplasm: Secondary | ICD-10-CM | POA: Diagnosis not present

## 2020-04-29 DIAGNOSIS — D485 Neoplasm of uncertain behavior of skin: Secondary | ICD-10-CM | POA: Diagnosis not present

## 2020-04-29 DIAGNOSIS — B079 Viral wart, unspecified: Secondary | ICD-10-CM | POA: Diagnosis not present

## 2020-05-21 DIAGNOSIS — E890 Postprocedural hypothyroidism: Secondary | ICD-10-CM | POA: Diagnosis not present

## 2020-05-21 DIAGNOSIS — C73 Malignant neoplasm of thyroid gland: Secondary | ICD-10-CM | POA: Diagnosis not present

## 2020-05-29 ENCOUNTER — Encounter: Payer: 59 | Admitting: Family Medicine

## 2020-06-05 ENCOUNTER — Encounter: Payer: Self-pay | Admitting: Family Medicine

## 2020-06-05 ENCOUNTER — Other Ambulatory Visit: Payer: Self-pay

## 2020-06-05 ENCOUNTER — Ambulatory Visit (INDEPENDENT_AMBULATORY_CARE_PROVIDER_SITE_OTHER): Payer: BC Managed Care – PPO | Admitting: Family Medicine

## 2020-06-05 VITALS — BP 124/78 | HR 64 | Temp 97.6°F | Ht 66.0 in | Wt 191.0 lb

## 2020-06-05 DIAGNOSIS — Z0000 Encounter for general adult medical examination without abnormal findings: Secondary | ICD-10-CM | POA: Diagnosis not present

## 2020-06-05 DIAGNOSIS — Z114 Encounter for screening for human immunodeficiency virus [HIV]: Secondary | ICD-10-CM | POA: Diagnosis not present

## 2020-06-05 DIAGNOSIS — Z1159 Encounter for screening for other viral diseases: Secondary | ICD-10-CM | POA: Diagnosis not present

## 2020-06-05 DIAGNOSIS — Z9189 Other specified personal risk factors, not elsewhere classified: Secondary | ICD-10-CM

## 2020-06-05 DIAGNOSIS — E669 Obesity, unspecified: Secondary | ICD-10-CM | POA: Diagnosis not present

## 2020-06-05 DIAGNOSIS — E559 Vitamin D deficiency, unspecified: Secondary | ICD-10-CM | POA: Diagnosis not present

## 2020-06-05 LAB — LIPID PANEL
Cholesterol: 171 mg/dL (ref 0–200)
HDL: 53.1 mg/dL (ref >39.00)
LDL Cholesterol: 99 mg/dL (ref 0–99)
NonHDL: 118.12
Total CHOL/HDL Ratio: 3
Triglycerides: 94 mg/dL (ref 0.0–149.0)
VLDL: 18.8 mg/dL (ref 0.0–40.0)

## 2020-06-05 LAB — CBC WITH DIFFERENTIAL/PLATELET
Basophils Absolute: 0 10*3/uL (ref 0.0–0.1)
Basophils Relative: 0.3 % (ref 0.0–3.0)
Eosinophils Absolute: 0.1 10*3/uL (ref 0.0–0.7)
Eosinophils Relative: 1.2 % (ref 0.0–5.0)
HCT: 38.5 % (ref 36.0–46.0)
Hemoglobin: 12.6 g/dL (ref 12.0–15.0)
Lymphocytes Relative: 23.2 % (ref 12.0–46.0)
Lymphs Abs: 1.8 10*3/uL (ref 0.7–4.0)
MCHC: 32.8 g/dL (ref 30.0–36.0)
MCV: 89.6 fl (ref 78.0–100.0)
Monocytes Absolute: 0.8 10*3/uL (ref 0.1–1.0)
Monocytes Relative: 10.6 % (ref 3.0–12.0)
Neutro Abs: 5.1 10*3/uL (ref 1.4–7.7)
Neutrophils Relative %: 64.7 % (ref 43.0–77.0)
Platelets: 352 10*3/uL (ref 150.0–400.0)
RBC: 4.3 Mil/uL (ref 3.87–5.11)
RDW: 13.7 % (ref 11.5–15.5)
WBC: 7.9 10*3/uL (ref 4.0–10.5)

## 2020-06-05 LAB — TSH: TSH: 4.17 u[IU]/mL (ref 0.35–4.50)

## 2020-06-05 LAB — HEPATIC FUNCTION PANEL
ALT: 15 U/L (ref 0–35)
AST: 14 U/L (ref 0–37)
Albumin: 4.2 g/dL (ref 3.5–5.2)
Alkaline Phosphatase: 65 U/L (ref 39–117)
Bilirubin, Direct: 0.1 mg/dL (ref 0.0–0.3)
Total Bilirubin: 0.3 mg/dL (ref 0.2–1.2)
Total Protein: 7.3 g/dL (ref 6.0–8.3)

## 2020-06-05 LAB — BASIC METABOLIC PANEL
BUN: 9 mg/dL (ref 6–23)
CO2: 26 mEq/L (ref 19–32)
Calcium: 9.3 mg/dL (ref 8.4–10.5)
Chloride: 104 mEq/L (ref 96–112)
Creatinine, Ser: 0.83 mg/dL (ref 0.40–1.20)
GFR: 77.73 mL/min (ref >60.00)
Glucose, Bld: 88 mg/dL (ref 70–99)
Potassium: 4.1 mEq/L (ref 3.5–5.1)
Sodium: 138 mEq/L (ref 135–145)

## 2020-06-05 LAB — VITAMIN D 25 HYDROXY (VIT D DEFICIENCY, FRACTURES): VITD: 20.61 ng/mL — ABNORMAL LOW (ref 30.00–100.00)

## 2020-06-05 MED ORDER — VALACYCLOVIR HCL 1 G PO TABS: ORAL_TABLET | ORAL | 1 refills | Status: DC

## 2020-06-05 NOTE — Patient Instructions (Signed)
Follow up in 1 year or as needed We'll notify you of your lab results and make any changes if needed Keep up the good work on healthy diet and regular exercise- you look great!!! Call with any questions or concerns Stay Safe!  Stay Healthy! 

## 2020-06-05 NOTE — Progress Notes (Signed)
   Subjective:    Patient ID: Natasha Fernandez, female    DOB: 16-Nov-1985, 34 y.o.   MRN: [id number]  HPI CPE- UTD on pap (Dr Marvel Plan), too young for mammo.  UTD on immunizations.  Pt has dropped 6 lbs since last visit- doing intermittent fasting.  No regular exercise.  Reviewed past medical, surgical, family and social histories.   Health Maintenance  Topic Date Due  . Hepatitis C Screening  Never done  . HIV Screening  Never done  . PAP SMEAR-Modifier  02/24/2020  . TETANUS/TDAP  08/23/2022  . COVID-19 Vaccine  Completed     Review of Systems Patient reports no vision/ hearing changes, adenopathy,fever, weight,  persistant/recurrent hoarseness , swallowing issues, chest pain, edema, persistant/recurrent cough, hemoptysis, dyspnea (rest/exertional/paroxysmal nocturnal), gastrointestinal bleeding (melena, rectal bleeding), abdominal pain, significant heartburn, bowel changes, GU symptoms (dysuria, hematuria, incontinence), Gyn symptoms (abnormal  bleeding, pain),  syncope, focal weakness, memory loss, numbness & tingling, skin/hair/nail changes, abnormal bruising or bleeding, anxiety, or depression.   + palpitations due to thyroid medication adjustments  This visit occurred during the SARS-CoV-2 public health emergency.  Safety protocols were in place, including screening questions prior to the visit, additional usage of staff PPE, and extensive cleaning of exam room while observing appropriate contact time as indicated for disinfecting solutions.       Objective:   Physical Exam General Appearance:    Alert, cooperative, no distress, appears stated age  Head:    Normocephalic, without obvious abnormality, atraumatic  Eyes:    PERRL, conjunctiva/corneas clear, EOM's intact, fundi    benign, both eyes  Ears:    Normal TM's and external ear canals, both ears  Nose:   Deferred due to COVID  Throat:   Neck:   Supple, symmetrical, trachea midline, no adenopathy;    Thyroid: no  enlargement/tenderness/nodules  Back:     Symmetric, no curvature, ROM normal, no CVA tenderness  Lungs:     Clear to auscultation bilaterally, respirations unlabored  Chest Wall:    No tenderness or deformity   Heart:    Regular rate and rhythm, S1 and S2 normal, no murmur, rub   or gallop  Breast Exam:    Deferred to GYN  Abdomen:     Soft, non-tender, bowel sounds active all four quadrants,    no masses, no organomegaly  Genitalia:    Deferred to GYN  Rectal:    Extremities:   Extremities normal, atraumatic, no cyanosis or edema  Pulses:   2+ and symmetric all extremities  Skin:   Skin color, texture, turgor normal, no rashes or lesions  Lymph nodes:   Cervical, supraclavicular, and axillary nodes normal  Neurologic:   CNII-XII intact, normal strength, sensation and reflexes    throughout          Assessment & Plan:

## 2020-06-05 NOTE — Assessment & Plan Note (Signed)
Pt's PE WNL w/ exception of obesity.  UTD on pap.  Too young for mammo.  Check labs.  Anticipatory guidance provided.

## 2020-06-05 NOTE — Assessment & Plan Note (Addendum)
Pt is down 6 lbs since last visit.  Applauded her efforts at healthy diet.  Will follow.

## 2020-06-05 NOTE — Assessment & Plan Note (Signed)
Check labs and replete prn. 

## 2020-06-08 ENCOUNTER — Other Ambulatory Visit: Payer: Self-pay | Admitting: General Practice

## 2020-06-08 LAB — HEPATITIS C ANTIBODY
Hepatitis C Ab: NONREACTIVE
SIGNAL TO CUT-OFF: 0 (ref <1.00)

## 2020-06-08 LAB — HIV ANTIBODY (ROUTINE TESTING W REFLEX): HIV 1&2 Ab, 4th Generation: NONREACTIVE

## 2020-06-08 MED ORDER — VITAMIN D (ERGOCALCIFEROL) 1.25 MG (50000 UNIT) PO CAPS: 50000.0000 [IU] | ORAL_CAPSULE | ORAL | 0 refills | Status: DC

## 2020-07-01 DIAGNOSIS — E890 Postprocedural hypothyroidism: Secondary | ICD-10-CM | POA: Diagnosis not present

## 2020-07-01 DIAGNOSIS — C73 Malignant neoplasm of thyroid gland: Secondary | ICD-10-CM | POA: Diagnosis not present

## 2020-07-15 ENCOUNTER — Other Ambulatory Visit: Payer: BC Managed Care – PPO

## 2020-07-15 DIAGNOSIS — Z20822 Contact with and (suspected) exposure to covid-19: Secondary | ICD-10-CM | POA: Diagnosis not present

## 2020-07-15 DIAGNOSIS — Z03818 Encounter for observation for suspected exposure to other biological agents ruled out: Secondary | ICD-10-CM | POA: Diagnosis not present

## 2020-07-16 LAB — NOVEL CORONAVIRUS, NAA: SARS-CoV-2, NAA: NOT DETECTED

## 2020-07-16 LAB — SARS-COV-2, NAA 2 DAY TAT

## 2020-08-03 DIAGNOSIS — N939 Abnormal uterine and vaginal bleeding, unspecified: Secondary | ICD-10-CM | POA: Diagnosis not present

## 2020-08-03 DIAGNOSIS — N979 Female infertility, unspecified: Secondary | ICD-10-CM | POA: Diagnosis not present

## 2020-08-11 DIAGNOSIS — R799 Abnormal finding of blood chemistry, unspecified: Secondary | ICD-10-CM | POA: Diagnosis not present

## 2020-08-11 DIAGNOSIS — N926 Irregular menstruation, unspecified: Secondary | ICD-10-CM | POA: Diagnosis not present

## 2020-08-17 DIAGNOSIS — E890 Postprocedural hypothyroidism: Secondary | ICD-10-CM | POA: Diagnosis not present

## 2020-08-17 DIAGNOSIS — N926 Irregular menstruation, unspecified: Secondary | ICD-10-CM | POA: Diagnosis not present

## 2020-08-17 DIAGNOSIS — C73 Malignant neoplasm of thyroid gland: Secondary | ICD-10-CM | POA: Diagnosis not present

## 2020-09-17 DIAGNOSIS — E890 Postprocedural hypothyroidism: Secondary | ICD-10-CM | POA: Diagnosis not present

## 2020-10-26 ENCOUNTER — Other Ambulatory Visit: Payer: BC Managed Care – PPO

## 2020-10-26 ENCOUNTER — Other Ambulatory Visit: Payer: Self-pay

## 2020-10-26 DIAGNOSIS — Z20822 Contact with and (suspected) exposure to covid-19: Secondary | ICD-10-CM

## 2020-10-29 LAB — NOVEL CORONAVIRUS, NAA: SARS-CoV-2, NAA: NOT DETECTED

## 2020-12-04 DIAGNOSIS — Z1151 Encounter for screening for human papillomavirus (HPV): Secondary | ICD-10-CM | POA: Diagnosis not present

## 2020-12-04 DIAGNOSIS — Z124 Encounter for screening for malignant neoplasm of cervix: Secondary | ICD-10-CM | POA: Diagnosis not present

## 2020-12-04 DIAGNOSIS — Z6831 Body mass index (BMI) 31.0-31.9, adult: Secondary | ICD-10-CM | POA: Diagnosis not present

## 2020-12-04 DIAGNOSIS — Z01419 Encounter for gynecological examination (general) (routine) without abnormal findings: Secondary | ICD-10-CM | POA: Diagnosis not present

## 2020-12-04 DIAGNOSIS — E559 Vitamin D deficiency, unspecified: Secondary | ICD-10-CM | POA: Diagnosis not present

## 2020-12-23 ENCOUNTER — Encounter: Payer: Self-pay | Admitting: Family Medicine

## 2020-12-23 MED ORDER — CLONAZEPAM 0.5 MG PO TABS: 0.5000 mg | ORAL_TABLET | Freq: Two times a day (BID) | ORAL | 0 refills | Status: DC | PRN

## 2020-12-23 NOTE — Telephone Encounter (Signed)
Please advise 

## 2021-02-04 ENCOUNTER — Encounter: Payer: Self-pay | Admitting: Family Medicine

## 2021-02-04 MED ORDER — METHOCARBAMOL 500 MG PO TABS: 500.0000 mg | ORAL_TABLET | Freq: Three times a day (TID) | ORAL | 0 refills | Status: DC | PRN

## 2021-04-14 ENCOUNTER — Encounter: Payer: Self-pay | Admitting: *Deleted

## 2021-04-16 ENCOUNTER — Telehealth: Payer: Self-pay

## 2021-04-16 NOTE — Telephone Encounter (Signed)
I have rescheduled Phy for 09/08 she is wanting to come in a week early to get labs done can you order labs for patient so we can make a lab appt?   Pt call back [phone]

## 2021-06-09 ENCOUNTER — Encounter: Payer: BC Managed Care – PPO | Admitting: Family Medicine

## 2021-07-14 ENCOUNTER — Encounter: Payer: Self-pay | Admitting: Family Medicine

## 2021-07-14 ENCOUNTER — Other Ambulatory Visit: Payer: Self-pay

## 2021-07-14 ENCOUNTER — Encounter: Payer: BC Managed Care – PPO | Admitting: Family Medicine

## 2021-07-14 ENCOUNTER — Ambulatory Visit (INDEPENDENT_AMBULATORY_CARE_PROVIDER_SITE_OTHER): Payer: BC Managed Care – PPO | Admitting: Family Medicine

## 2021-07-14 VITALS — BP 118/72 | HR 81 | Temp 97.5°F | Resp 16 | Ht 64.5 in | Wt 194.0 lb

## 2021-07-14 DIAGNOSIS — E669 Obesity, unspecified: Secondary | ICD-10-CM | POA: Diagnosis not present

## 2021-07-14 DIAGNOSIS — Z0000 Encounter for general adult medical examination without abnormal findings: Secondary | ICD-10-CM | POA: Diagnosis not present

## 2021-07-14 DIAGNOSIS — E559 Vitamin D deficiency, unspecified: Secondary | ICD-10-CM

## 2021-07-14 LAB — BASIC METABOLIC PANEL
BUN: 8 mg/dL (ref 6–23)
CO2: 25 mEq/L (ref 19–32)
Calcium: 9.1 mg/dL (ref 8.4–10.5)
Chloride: 103 mEq/L (ref 96–112)
Creatinine, Ser: 0.79 mg/dL (ref 0.40–1.20)
GFR: 95.69 mL/min (ref >60.00)
Glucose, Bld: 72 mg/dL (ref 70–99)
Potassium: 3.9 mEq/L (ref 3.5–5.1)
Sodium: 137 mEq/L (ref 135–145)

## 2021-07-14 LAB — CBC WITH DIFFERENTIAL/PLATELET
Basophils Absolute: 0 10*3/uL (ref 0.0–0.1)
Basophils Relative: 0.5 % (ref 0.0–3.0)
Eosinophils Absolute: 0.1 10*3/uL (ref 0.0–0.7)
Eosinophils Relative: 1.4 % (ref 0.0–5.0)
HCT: 34.6 % — ABNORMAL LOW (ref 36.0–46.0)
Hemoglobin: 11.1 g/dL — ABNORMAL LOW (ref 12.0–15.0)
Lymphocytes Relative: 22.9 % (ref 12.0–46.0)
Lymphs Abs: 1.6 10*3/uL (ref 0.7–4.0)
MCHC: 32.1 g/dL (ref 30.0–36.0)
MCV: 81.4 fl (ref 78.0–100.0)
Monocytes Absolute: 0.6 10*3/uL (ref 0.1–1.0)
Monocytes Relative: 8.5 % (ref 3.0–12.0)
Neutro Abs: 4.7 10*3/uL (ref 1.4–7.7)
Neutrophils Relative %: 66.7 % (ref 43.0–77.0)
Platelets: 398 10*3/uL (ref 150.0–400.0)
RBC: 4.24 Mil/uL (ref 3.87–5.11)
RDW: 17.2 % — ABNORMAL HIGH (ref 11.5–15.5)
WBC: 7.1 10*3/uL (ref 4.0–10.5)

## 2021-07-14 LAB — HEPATIC FUNCTION PANEL
ALT: 17 U/L (ref 0–35)
AST: 15 U/L (ref 0–37)
Albumin: 4.2 g/dL (ref 3.5–5.2)
Alkaline Phosphatase: 61 U/L (ref 39–117)
Bilirubin, Direct: 0.1 mg/dL (ref 0.0–0.3)
Total Bilirubin: 0.4 mg/dL (ref 0.2–1.2)
Total Protein: 7.4 g/dL (ref 6.0–8.3)

## 2021-07-14 LAB — LIPID PANEL
Cholesterol: 163 mg/dL (ref 0–200)
HDL: 50.8 mg/dL (ref >39.00)
LDL Cholesterol: 91 mg/dL (ref 0–99)
NonHDL: 112.69
Total CHOL/HDL Ratio: 3
Triglycerides: 106 mg/dL (ref 0.0–149.0)
VLDL: 21.2 mg/dL (ref 0.0–40.0)

## 2021-07-14 LAB — VITAMIN D 25 HYDROXY (VIT D DEFICIENCY, FRACTURES): VITD: 24.09 ng/mL — ABNORMAL LOW (ref 30.00–100.00)

## 2021-07-14 LAB — HM PAP SMEAR: HM Pap smear: NEGATIVE

## 2021-07-14 LAB — TSH: TSH: 2.06 u[IU]/mL (ref 0.35–5.50)

## 2021-07-14 MED ORDER — VALACYCLOVIR HCL 1 G PO TABS: ORAL_TABLET | ORAL | 1 refills | Status: DC

## 2021-07-14 NOTE — Progress Notes (Signed)
   Subjective:    Patient ID: Natasha Fernandez, female    DOB: April 26, 1986, 35 y.o.   MRN: [id number]  HPI CPE- UTD on pap, too young for mammo or colonoscopy.  UTD on Tdap.  Declines flu.  No concerns today  Patient Care Team    Relationship Specialty Notifications Start End  Midge Minium, MD PCP - General Family Medicine  04/06/11   Paula Compton, MD Consulting Physician Obstetrics and Gynecology  05/29/19     Health Maintenance  Topic Date Due  . PAP SMEAR-Modifier  02/24/2020  . COVID-19 Vaccine (3 - Pfizer risk series) 07/30/2021 (Originally 02/07/2020)  . TETANUS/TDAP  08/23/2022  . Hepatitis C Screening  Completed  . HIV Screening  Completed  . HPV VACCINES  Aged Out      Review of Systems Patient reports no vision/ hearing changes, adenopathy,fever, weight change,  persistant/recurrent hoarseness , swallowing issues, chest pain, palpitations, edema, persistant/recurrent cough, hemoptysis, dyspnea (rest/exertional/paroxysmal nocturnal), gastrointestinal bleeding (melena, rectal bleeding), abdominal pain, significant heartburn, bowel changes, GU symptoms (dysuria, hematuria, incontinence), Gyn symptoms (abnormal  bleeding, pain),  syncope, focal weakness, memory loss, numbness & tingling, skin/hair/nail changes, abnormal bruising or bleeding, anxiety, or depression.   This visit occurred during the SARS-CoV-2 public health emergency.  Safety protocols were in place, including screening questions prior to the visit, additional usage of staff PPE, and extensive cleaning of exam room while observing appropriate contact time as indicated for disinfecting solutions.      Objective:   Physical Exam General Appearance:    Alert, cooperative, no distress, appears stated age, obese  Head:    Normocephalic, without obvious abnormality, atraumatic  Eyes:    PERRL, conjunctiva/corneas clear, EOM's intact, fundi    benign, both eyes  Ears:    Normal TM's and external ear canals, both  ears  Nose:   Deferred due to COVID  Throat:   Neck:   Supple, symmetrical, trachea midline, no adenopathy;    Thyroid: no enlargement/tenderness/nodules  Back:     Symmetric, no curvature, ROM normal, no CVA tenderness  Lungs:     Clear to auscultation bilaterally, respirations unlabored  Chest Wall:    No tenderness or deformity   Heart:    Regular rate and rhythm, S1 and S2 normal, no murmur, rub   or gallop  Breast Exam:    Deferred to GYN  Abdomen:     Soft, non-tender, bowel sounds active all four quadrants,    no masses, no organomegaly  Genitalia:    Deferred to GYN  Rectal:    Extremities:   Extremities normal, atraumatic, no cyanosis or edema  Pulses:   2+ and symmetric all extremities  Skin:   Skin color, texture, turgor normal, no rashes or lesions  Lymph nodes:   Cervical, supraclavicular, and axillary nodes normal  Neurologic:   CNII-XII intact, normal strength, sensation and reflexes    throughout          Assessment & Plan:

## 2021-07-14 NOTE — Patient Instructions (Addendum)
Follow up in 1 year or as needed We'll notify you of your lab results and make any changes if needed Continue to work on healthy diet and regular exercise- you're doing great!!! Call with any questions or concerns Stay Safe! Stay Healthy! Happy Fall!!! 

## 2021-07-15 ENCOUNTER — Other Ambulatory Visit: Payer: Self-pay

## 2021-07-15 ENCOUNTER — Encounter: Payer: Self-pay | Admitting: Family Medicine

## 2021-07-15 DIAGNOSIS — E559 Vitamin D deficiency, unspecified: Secondary | ICD-10-CM

## 2021-07-15 MED ORDER — VITAMIN D (ERGOCALCIFEROL) 1.25 MG (50000 UNIT) PO CAPS: 50000.0000 [IU] | ORAL_CAPSULE | ORAL | 0 refills | Status: DC

## 2021-07-15 NOTE — Assessment & Plan Note (Signed)
Check labs and replete prn. 

## 2021-07-15 NOTE — Assessment & Plan Note (Signed)
Ongoing issue for pt.  BMI is 32.79  Encouraged healthy diet and regular exercise.  Will check labs to risk stratify.

## 2021-07-15 NOTE — Assessment & Plan Note (Signed)
Pt's PE WNL w/ exception of obesity.  UTD on pap.  UTD on Tdap.  Allergic to flu.  Check labs.  Anticipatory guidance provided.

## 2021-08-17 ENCOUNTER — Encounter: Payer: Self-pay | Admitting: Family Medicine

## 2021-09-01 DIAGNOSIS — S8001XA Contusion of right knee, initial encounter: Secondary | ICD-10-CM | POA: Diagnosis not present

## 2021-09-01 DIAGNOSIS — S93505A Unspecified sprain of left lesser toe(s), initial encounter: Secondary | ICD-10-CM | POA: Diagnosis not present

## 2021-09-01 DIAGNOSIS — S7002XA Contusion of left hip, initial encounter: Secondary | ICD-10-CM | POA: Diagnosis not present

## 2021-09-13 DIAGNOSIS — S8001XD Contusion of right knee, subsequent encounter: Secondary | ICD-10-CM | POA: Diagnosis not present

## 2021-09-21 DIAGNOSIS — E890 Postprocedural hypothyroidism: Secondary | ICD-10-CM | POA: Diagnosis not present

## 2021-09-21 DIAGNOSIS — C73 Malignant neoplasm of thyroid gland: Secondary | ICD-10-CM | POA: Diagnosis not present

## 2021-10-14 ENCOUNTER — Encounter: Payer: Self-pay | Admitting: Family Medicine

## 2021-10-19 ENCOUNTER — Telehealth: Payer: Self-pay

## 2021-10-19 DIAGNOSIS — E890 Postprocedural hypothyroidism: Secondary | ICD-10-CM | POA: Diagnosis not present

## 2021-10-19 NOTE — Telephone Encounter (Signed)
Caller name:Klea Delaurentis   On DPR? :Yes  Call back [phone]  Provider they see: Birdie Riddle   Reason for call:Pt is calling ha virtual over phone this weekend pt was put on amoxicillin 875mg  every hours for 10 days she is taken 3 dosages she started yesterday. Pt is doing normal over the counter mucinex and cough meds and she is wanting to know how long will it be before she starts to see improvement

## 2021-10-19 NOTE — Telephone Encounter (Signed)
Called pt back and dicussed status, started abx 11 am 10/18/21 advised pt to continue abx and OTC treatments through 72 hours and reports back given ER/UC precautions if worsening pt will call back 10/20/21 or 10/21/21 for appt if no improvement

## 2021-10-21 ENCOUNTER — Other Ambulatory Visit: Payer: Self-pay

## 2021-10-21 ENCOUNTER — Encounter: Payer: Self-pay | Admitting: Family Medicine

## 2021-10-21 ENCOUNTER — Ambulatory Visit (HOSPITAL_COMMUNITY)
Admission: RE | Admit: 2021-10-21 | Discharge: 2021-10-21 | Disposition: A | Discharge status: Home / Self Care | Payer: BC Managed Care – PPO | Source: Ambulatory Visit | Attending: Family Medicine | Admitting: Family Medicine

## 2021-10-21 ENCOUNTER — Ambulatory Visit (INDEPENDENT_AMBULATORY_CARE_PROVIDER_SITE_OTHER): Payer: BC Managed Care – PPO | Admitting: Family Medicine

## 2021-10-21 VITALS — BP 111/83 | HR 74 | Temp 97.8°F | Resp 16 | Ht 64.5 in | Wt 201.2 lb

## 2021-10-21 DIAGNOSIS — R051 Acute cough: Secondary | ICD-10-CM | POA: Insufficient documentation

## 2021-10-21 DIAGNOSIS — R059 Cough, unspecified: Secondary | ICD-10-CM | POA: Diagnosis not present

## 2021-10-21 DIAGNOSIS — R0789 Other chest pain: Secondary | ICD-10-CM | POA: Diagnosis not present

## 2021-10-21 DIAGNOSIS — U071 COVID-19: Secondary | ICD-10-CM

## 2021-10-21 DIAGNOSIS — J0100 Acute maxillary sinusitis, unspecified: Secondary | ICD-10-CM

## 2021-10-21 MED ORDER — AMOXICILLIN-POT CLAVULANATE 875-125 MG PO TABS: 1.0000 | ORAL_TABLET | Freq: Two times a day (BID) | ORAL | 0 refills | Status: DC

## 2021-10-21 MED ORDER — BENZONATATE 100 MG PO CAPS: 100.0000 mg | ORAL_CAPSULE | Freq: Three times a day (TID) | ORAL | 0 refills | Status: DC | PRN

## 2021-10-21 NOTE — Progress Notes (Signed)
Subjective:  Patient ID: Natasha Fernandez, female    DOB: 06/03/1986  Age: 36 y.o. MRN: [id number]  CC:  Chief Complaint  Patient presents with   Follow-up    Patient states last Tuesday she tested positive for covid and was feeling fine and Sunday has gotten worse. Pt states on Monday she was prescribed amoxicillin and she is still have a cough , pain in upper back, diarrhea, eye pain as well.    HPI Natasha Fernandez presents for  COVID-19 infection Initial symptoms 9 days ago, starting 10/12/21.  Positive testing 10/13/2021.  Treated with otc mucinex, advil, decongestant. Improving until 5 days ago when pain in sinuses, more congestion, green nasal congestion, more cough and chest congestion with discolored phlegm. [facility] visit 3 days ago -  She was started on amoxicillin 875 mg 3 days ago.  Still with cough - slight improvement in cough but more sore in chest and back. No fever past few days. Taking advil consistently for body aches. Pain behind the eyes, no vision changes. No drainage, no eye redness.  No dyspnea unless coughing.  Diarrhea after antibiotic once or twice after pill - hx of IBS.  No prior hx of pna, but vocal cord paralysis and reported increased risk of PNA.    Telemedicine visit 2 days ago with [facility] (history of postsurgical hypothyroidism, papillary thyroid carcinoma).  Plan for repeat TSH 4 weeks after COVID resolution, continued on 125 mcg Synthroid daily with plan for attempting IUI again in the future.  History Patient Active Problem List   Diagnosis Date Noted   Obesity (BMI 30-39.9) 05/29/2019   Vitamin D deficiency 05/29/2019   History of thyroid cancer 02/08/2017   General medical examination 03/21/2011   Past Medical History:  Diagnosis Date   Anemia    hx   Complication of anesthesia    Family history of adverse reaction to anesthesia    mom nausea and vomiting   GERD (gastroesophageal reflux disease)    Hay fever    History of  chicken pox    Hypertension    IBS (irritable bowel syndrome)    Migraine    hx   PONV (postoperative nausea and vomiting)    Past Surgical History:  Procedure Laterality Date   CHOLECYSTECTOMY  2008   THYROIDECTOMY  02/08/2017   THYROIDECTOMY N/A 02/08/2017   Procedure: TOTAL THYROIDECTOMY;  Surgeon: Izora Gala, MD;  Location: [facility] OR;  Service: ENT;  Laterality: N/A;   Allergies  Allergen Reactions   Hemophilus B Polysaccharide Vaccine Anaphylaxis   Influenza Vaccine Live Anaphylaxis   Other Anaphylaxis   Prior to Admission medications   Medication Sig Start Date End Date Taking? Authorizing Provider  Cholecalciferol (VITAMIN D) 50 MCG (2000 UT) CAPS  03/15/19  Yes [provider]  levothyroxine (SYNTHROID) 125 MCG tablet Take by mouth daily before breakfast.  03/12/19  Yes [provider]  valACYclovir (VALTREX) 1000 MG tablet 2 tabs q12 x2 doses at first sign of cold sores 07/14/21  Yes Midge Minium, MD  Vitamin D, Ergocalciferol, (DRISDOL) 1.25 MG (50000 UNIT) CAPS capsule Take 1 capsule (50,000 Units total) by mouth every 7 (seven) days. 07/15/21  Yes Midge Minium, MD   Social History   Socioeconomic History   Marital status: Married    Spouse name: Not on file   Number of children: Not on file   Years of education: Not on file   Highest education level: Not on file  Occupational History   Not on file  Tobacco Use   Smoking status: Never   Smokeless tobacco: Never  Substance and Sexual Activity   Alcohol use: Yes    Comment: rare   Drug use: No   Sexual activity: Yes    Birth control/protection: None  Other Topics Concern   Not on file  Social History Narrative   Not on file   Social Determinants of Health   Financial Resource Strain: Not on file  Food Insecurity: Not on file  Transportation Needs: Not on file  Physical Activity: Not on file  Stress: Not on file  Social Connections: Not on file  Intimate Partner Violence: Not  on file    Review of Systems Per HPI.   Objective:   Vitals:   10/21/21 1312  BP: 111/83  Pulse: 74  Resp: 16  Temp: 97.8 F (36.6 C)  TempSrc: Temporal  Weight: 201 lb 3.2 oz (91.3 kg)  Height: 5' 4.5" (1.638 m)     Physical Exam Vitals reviewed.  Constitutional:      General: She is not in acute distress.    Appearance: She is well-developed.  HENT:     Head: Normocephalic and atraumatic.     Right Ear: Hearing, tympanic membrane, ear canal and external ear normal.     Left Ear: Hearing, tympanic membrane, ear canal and external ear normal.     Nose: Nose normal.     Comments: Maxillary sinus tenderness bilaterally.  Frontal sinuses nontender.  No appreciable swollen lymph nodes of the neck.  No stridor of neck.  No wheeze.    Mouth/Throat:     Pharynx: No posterior oropharyngeal erythema.  Eyes:     Conjunctiva/sclera: Conjunctivae normal.     Pupils: Pupils are equal, round, and reactive to light.  Cardiovascular:     Rate and Rhythm: Normal rate and regular rhythm.     Heart sounds: Normal heart sounds. No murmur heard. Pulmonary:     Effort: Pulmonary effort is normal. No respiratory distress.     Breath sounds: Normal breath sounds. No stridor. No wheezing or rhonchi.  Skin:    General: Skin is warm and dry.     Findings: No rash.  Neurological:     Mental Status: She is alert and oriented to person, place, and time.  Psychiatric:        Mood and Affect: Mood normal.        Behavior: Behavior normal.       Assessment & Plan:  Natasha Fernandez is a 36 y.o. female . COVID-19 virus infection  Acute maxillary sinusitis, recurrence not specified - Plan: amoxicillin-clavulanate (AUGMENTIN) 875-125 MG tablet  Acute cough - Plan: DG Chest 2 View, benzonatate (TESSALON) 100 MG capsule, DISCONTINUED: benzonatate (TESSALON) 100 MG capsule  Chest wall pain - Plan: DG Chest 2 View, DISCONTINUED: benzonatate (TESSALON) 100 MG capsule  COVID-19 infection with  some initial improvement, but then likely secondary bacterial sinusitis.  Increased cough, chest symptoms likely drainage from sinuses but with her vocal cord issue will check chest x-ray to rule out infiltrate.  Lungs were clear on exam.  Reassuring exam.  Tessalon Perles if needed for cough, but she plans to clear that medication with her ENT given her previous vocal cord issues.  If sinuses not improving tomorrow can switch amoxicillin to Augmentin, potential side effects including diarrhea were discussed.  ER/RTC precautions given.  Return to work Monday if symptoms are improving as now on  day 9.  Meds ordered this encounter  Medications   amoxicillin-clavulanate (AUGMENTIN) 875-125 MG tablet    Sig: Take 1 tablet by mouth 2 (two) times daily.    Dispense:  20 tablet    Refill:  0   DISCONTD: benzonatate (TESSALON) 100 MG capsule    Sig: Take 1 capsule (100 mg total) by mouth 3 (three) times daily as needed for cough.    Dispense:  20 capsule    Refill:  0   benzonatate (TESSALON) 100 MG capsule    Sig: Take 1 capsule (100 mg total) by mouth 3 (three) times daily as needed for cough.    Dispense:  20 capsule    Refill:  0   Patient Instructions  If sinuses are not improving into tomorrow, then change to Augmentin.  Probiotic may be helpful to lessen diarrhea.  Please obtain x-ray at [place], [address] this afternoon.  If any concerns I will let you know.  Ibuprofen for chest wall pain -likely because of soreness from repetitive coughing.  Tessalon Perles if needed for cough and cleared to use it by your ENT.  Hope you feel better soon.  Keep [place] posted.        Signed,   Merri Ray, MD [place] 10/21/21 1:51 PM

## 2021-10-21 NOTE — Patient Instructions (Addendum)
If sinuses are not improving into tomorrow, then change to Augmentin.  Probiotic may be helpful to lessen diarrhea.  Please obtain x-ray at [place], [address] this afternoon.  If any concerns I will let you know.  Ibuprofen for chest wall pain -likely because of soreness from repetitive coughing.  Tessalon Perles if needed for cough and cleared to use it by your ENT.  Hope you feel better soon.  Keep [place] posted.

## 2021-10-22 ENCOUNTER — Ambulatory Visit: Payer: BC Managed Care – PPO | Admitting: Registered Nurse

## 2021-11-09 DIAGNOSIS — C73 Malignant neoplasm of thyroid gland: Secondary | ICD-10-CM | POA: Diagnosis not present

## 2021-11-09 DIAGNOSIS — J3800 Paralysis of vocal cords and larynx, unspecified: Secondary | ICD-10-CM | POA: Diagnosis not present

## 2021-11-09 DIAGNOSIS — K219 Gastro-esophageal reflux disease without esophagitis: Secondary | ICD-10-CM | POA: Diagnosis not present

## 2021-11-09 DIAGNOSIS — R490 Dysphonia: Secondary | ICD-10-CM | POA: Diagnosis not present

## 2021-12-01 DIAGNOSIS — E890 Postprocedural hypothyroidism: Secondary | ICD-10-CM | POA: Diagnosis not present

## 2021-12-01 DIAGNOSIS — C73 Malignant neoplasm of thyroid gland: Secondary | ICD-10-CM | POA: Diagnosis not present

## 2021-12-29 DIAGNOSIS — L821 Other seborrheic keratosis: Secondary | ICD-10-CM | POA: Diagnosis not present

## 2021-12-29 DIAGNOSIS — L814 Other melanin hyperpigmentation: Secondary | ICD-10-CM | POA: Diagnosis not present

## 2021-12-29 DIAGNOSIS — L578 Other skin changes due to chronic exposure to nonionizing radiation: Secondary | ICD-10-CM | POA: Diagnosis not present

## 2021-12-29 DIAGNOSIS — D229 Melanocytic nevi, unspecified: Secondary | ICD-10-CM | POA: Diagnosis not present

## 2021-12-29 DIAGNOSIS — L570 Actinic keratosis: Secondary | ICD-10-CM | POA: Diagnosis not present

## 2022-01-17 DIAGNOSIS — F4323 Adjustment disorder with mixed anxiety and depressed mood: Secondary | ICD-10-CM | POA: Diagnosis not present

## 2022-01-24 DIAGNOSIS — Z1389 Encounter for screening for other disorder: Secondary | ICD-10-CM | POA: Diagnosis not present

## 2022-01-24 DIAGNOSIS — E039 Hypothyroidism, unspecified: Secondary | ICD-10-CM | POA: Diagnosis not present

## 2022-01-24 DIAGNOSIS — N939 Abnormal uterine and vaginal bleeding, unspecified: Secondary | ICD-10-CM | POA: Diagnosis not present

## 2022-01-24 DIAGNOSIS — Z130 Encounter for screening for diseases of the blood and blood-forming organs and certain disorders involving the immune mechanism: Secondary | ICD-10-CM | POA: Diagnosis not present

## 2022-01-24 DIAGNOSIS — Z01419 Encounter for gynecological examination (general) (routine) without abnormal findings: Secondary | ICD-10-CM | POA: Diagnosis not present

## 2022-01-24 DIAGNOSIS — Z124 Encounter for screening for malignant neoplasm of cervix: Secondary | ICD-10-CM | POA: Diagnosis not present

## 2022-01-24 DIAGNOSIS — N979 Female infertility, unspecified: Secondary | ICD-10-CM | POA: Diagnosis not present

## 2022-01-31 DIAGNOSIS — F4323 Adjustment disorder with mixed anxiety and depressed mood: Secondary | ICD-10-CM | POA: Diagnosis not present

## 2022-02-01 ENCOUNTER — Encounter: Payer: Self-pay | Admitting: Family Medicine

## 2022-02-02 LAB — HM PAP SMEAR: HPV, high-risk: NEGATIVE

## 2022-02-07 DIAGNOSIS — F4323 Adjustment disorder with mixed anxiety and depressed mood: Secondary | ICD-10-CM | POA: Diagnosis not present

## 2022-02-14 DIAGNOSIS — F4323 Adjustment disorder with mixed anxiety and depressed mood: Secondary | ICD-10-CM | POA: Diagnosis not present

## 2022-02-28 DIAGNOSIS — F4323 Adjustment disorder with mixed anxiety and depressed mood: Secondary | ICD-10-CM | POA: Diagnosis not present

## 2022-03-02 DIAGNOSIS — Z319 Encounter for procreative management, unspecified: Secondary | ICD-10-CM | POA: Diagnosis not present

## 2022-03-02 DIAGNOSIS — N979 Female infertility, unspecified: Secondary | ICD-10-CM | POA: Diagnosis not present

## 2022-03-07 DIAGNOSIS — F4323 Adjustment disorder with mixed anxiety and depressed mood: Secondary | ICD-10-CM | POA: Diagnosis not present

## 2022-03-21 DIAGNOSIS — F4323 Adjustment disorder with mixed anxiety and depressed mood: Secondary | ICD-10-CM | POA: Diagnosis not present

## 2022-03-28 DIAGNOSIS — F4323 Adjustment disorder with mixed anxiety and depressed mood: Secondary | ICD-10-CM | POA: Diagnosis not present

## 2022-04-04 DIAGNOSIS — F4323 Adjustment disorder with mixed anxiety and depressed mood: Secondary | ICD-10-CM | POA: Diagnosis not present

## 2022-04-11 DIAGNOSIS — F4323 Adjustment disorder with mixed anxiety and depressed mood: Secondary | ICD-10-CM | POA: Diagnosis not present

## 2022-04-25 DIAGNOSIS — F4323 Adjustment disorder with mixed anxiety and depressed mood: Secondary | ICD-10-CM | POA: Diagnosis not present

## 2022-05-09 DIAGNOSIS — F4323 Adjustment disorder with mixed anxiety and depressed mood: Secondary | ICD-10-CM | POA: Diagnosis not present

## 2022-05-16 ENCOUNTER — Emergency Department (HOSPITAL_BASED_OUTPATIENT_CLINIC_OR_DEPARTMENT_OTHER): Payer: BC Managed Care – PPO

## 2022-05-16 ENCOUNTER — Emergency Department (HOSPITAL_BASED_OUTPATIENT_CLINIC_OR_DEPARTMENT_OTHER)
Admission: EM | Admit: 2022-05-16 | Discharge: 2022-05-16 | Disposition: A | Discharge status: Home / Self Care | Payer: BC Managed Care – PPO | Attending: Emergency Medicine | Admitting: Emergency Medicine

## 2022-05-16 ENCOUNTER — Encounter (HOSPITAL_BASED_OUTPATIENT_CLINIC_OR_DEPARTMENT_OTHER): Payer: Self-pay

## 2022-05-16 DIAGNOSIS — F4323 Adjustment disorder with mixed anxiety and depressed mood: Secondary | ICD-10-CM | POA: Diagnosis not present

## 2022-05-16 DIAGNOSIS — Y92410 Unspecified street and highway as the place of occurrence of the external cause: Secondary | ICD-10-CM | POA: Insufficient documentation

## 2022-05-16 DIAGNOSIS — S8002XA Contusion of left knee, initial encounter: Secondary | ICD-10-CM | POA: Diagnosis not present

## 2022-05-16 DIAGNOSIS — S161XXA Strain of muscle, fascia and tendon at neck level, initial encounter: Secondary | ICD-10-CM | POA: Insufficient documentation

## 2022-05-16 DIAGNOSIS — S199XXA Unspecified injury of neck, initial encounter: Secondary | ICD-10-CM | POA: Diagnosis not present

## 2022-05-16 DIAGNOSIS — Z85850 Personal history of malignant neoplasm of thyroid: Secondary | ICD-10-CM | POA: Insufficient documentation

## 2022-05-16 DIAGNOSIS — M25562 Pain in left knee: Secondary | ICD-10-CM | POA: Diagnosis not present

## 2022-05-16 MED ORDER — METHOCARBAMOL 500 MG PO TABS: 500.0000 mg | ORAL_TABLET | Freq: Two times a day (BID) | ORAL | 0 refills | Status: DC

## 2022-05-16 MED ORDER — METHOCARBAMOL 500 MG PO TABS
500.0000 mg | ORAL_TABLET | Freq: Once | ORAL | Status: AC
  Administered 2022-05-16: 500 mg via ORAL
  Filled 2022-05-16: qty 1

## 2022-05-16 MED ORDER — LIDOCAINE 5 % EX PTCH
1.0000 | MEDICATED_PATCH | CUTANEOUS | Status: DC
  Administered 2022-05-16: 1 via TRANSDERMAL
  Filled 2022-05-16: qty 1

## 2022-05-16 MED ORDER — KETOROLAC TROMETHAMINE 15 MG/ML IJ SOLN
15.0000 mg | Freq: Once | INTRAMUSCULAR | Status: AC
Start: 2022-05-16 — End: 2022-05-16
  Administered 2022-05-16: 15 mg via INTRAMUSCULAR
  Filled 2022-05-16: qty 1

## 2022-05-16 MED ORDER — IBUPROFEN 600 MG PO TABS: 600.0000 mg | ORAL_TABLET | Freq: Four times a day (QID) | ORAL | 0 refills | Status: DC | PRN
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
## 2022-05-16 NOTE — ED Notes (Signed)
Discharge instructions reviewed with patient. Patient verbalizes understanding, no further questions at this time. Medications/prescriptions and follow up information provided. No acute distress noted at time of departure.  

## 2022-06-06 DIAGNOSIS — F4323 Adjustment disorder with mixed anxiety and depressed mood: Secondary | ICD-10-CM | POA: Diagnosis not present

## 2022-06-13 DIAGNOSIS — F4323 Adjustment disorder with mixed anxiety and depressed mood: Secondary | ICD-10-CM | POA: Diagnosis not present

## 2022-06-27 DIAGNOSIS — F4323 Adjustment disorder with mixed anxiety and depressed mood: Secondary | ICD-10-CM | POA: Diagnosis not present

## 2022-07-04 DIAGNOSIS — F4323 Adjustment disorder with mixed anxiety and depressed mood: Secondary | ICD-10-CM | POA: Diagnosis not present

## 2022-07-11 DIAGNOSIS — F4323 Adjustment disorder with mixed anxiety and depressed mood: Secondary | ICD-10-CM | POA: Diagnosis not present

## 2022-07-15 ENCOUNTER — Encounter: Payer: BC Managed Care – PPO | Admitting: Family Medicine

## 2022-07-18 DIAGNOSIS — F4323 Adjustment disorder with mixed anxiety and depressed mood: Secondary | ICD-10-CM | POA: Diagnosis not present

## 2022-07-21 ENCOUNTER — Ambulatory Visit (INDEPENDENT_AMBULATORY_CARE_PROVIDER_SITE_OTHER): Payer: BC Managed Care – PPO | Admitting: Family Medicine

## 2022-07-21 ENCOUNTER — Encounter: Payer: Self-pay | Admitting: Family Medicine

## 2022-07-21 VITALS — BP 122/70 | HR 92 | Temp 98.2°F | Resp 17 | Ht 65.0 in | Wt 184.0 lb

## 2022-07-21 DIAGNOSIS — E559 Vitamin D deficiency, unspecified: Secondary | ICD-10-CM | POA: Diagnosis not present

## 2022-07-21 DIAGNOSIS — Z0000 Encounter for general adult medical examination without abnormal findings: Secondary | ICD-10-CM | POA: Diagnosis not present

## 2022-07-21 DIAGNOSIS — E669 Obesity, unspecified: Secondary | ICD-10-CM | POA: Diagnosis not present

## 2022-07-21 LAB — CBC WITH DIFFERENTIAL/PLATELET
Basophils Absolute: 0 10*3/uL (ref 0.0–0.1)
Basophils Relative: 0.2 % (ref 0.0–3.0)
Eosinophils Absolute: 0.1 10*3/uL (ref 0.0–0.7)
Eosinophils Relative: 0.9 % (ref 0.0–5.0)
HCT: 32.1 % — ABNORMAL LOW (ref 36.0–46.0)
Hemoglobin: 10.2 g/dL — ABNORMAL LOW (ref 12.0–15.0)
Lymphocytes Relative: 21.5 % (ref 12.0–46.0)
Lymphs Abs: 1.6 10*3/uL (ref 0.7–4.0)
MCHC: 31.7 g/dL (ref 30.0–36.0)
MCV: 75.7 fl — ABNORMAL LOW (ref 78.0–100.0)
Monocytes Absolute: 0.7 10*3/uL (ref 0.1–1.0)
Monocytes Relative: 9.9 % (ref 3.0–12.0)
Neutro Abs: 5.1 10*3/uL (ref 1.4–7.7)
Neutrophils Relative %: 67.5 % (ref 43.0–77.0)
Platelets: 343 10*3/uL (ref 150.0–400.0)
RBC: 4.24 Mil/uL (ref 3.87–5.11)
RDW: 19.3 % — ABNORMAL HIGH (ref 11.5–15.5)
WBC: 7.5 10*3/uL (ref 4.0–10.5)

## 2022-07-21 LAB — BASIC METABOLIC PANEL
BUN: 12 mg/dL (ref 6–23)
CO2: 25 mEq/L (ref 19–32)
Calcium: 8.9 mg/dL (ref 8.4–10.5)
Chloride: 105 mEq/L (ref 96–112)
Creatinine, Ser: 0.83 mg/dL (ref 0.40–1.20)
GFR: 89.54 mL/min (ref >60.00)
Glucose, Bld: 107 mg/dL — ABNORMAL HIGH (ref 70–99)
Potassium: 2.9 mEq/L — ABNORMAL LOW (ref 3.5–5.1)
Sodium: 137 mEq/L (ref 135–145)

## 2022-07-21 LAB — HEPATIC FUNCTION PANEL
ALT: 12 U/L (ref 0–35)
AST: 11 U/L (ref 0–37)
Albumin: 4.2 g/dL (ref 3.5–5.2)
Alkaline Phosphatase: 63 U/L (ref 39–117)
Bilirubin, Direct: 0.1 mg/dL (ref 0.0–0.3)
Total Bilirubin: 0.4 mg/dL (ref 0.2–1.2)
Total Protein: 7.5 g/dL (ref 6.0–8.3)

## 2022-07-21 LAB — LIPID PANEL
Cholesterol: 147 mg/dL (ref 0–200)
HDL: 49.8 mg/dL (ref >39.00)
LDL Cholesterol: 84 mg/dL (ref 0–99)
NonHDL: 97.47
Total CHOL/HDL Ratio: 3
Triglycerides: 66 mg/dL (ref 0.0–149.0)
VLDL: 13.2 mg/dL (ref 0.0–40.0)

## 2022-07-21 LAB — VITAMIN D 25 HYDROXY (VIT D DEFICIENCY, FRACTURES): VITD: 25.81 ng/mL — ABNORMAL LOW (ref 30.00–100.00)

## 2022-07-21 LAB — TSH: TSH: 0.11 u[IU]/mL — ABNORMAL LOW (ref 0.35–5.50)

## 2022-07-21 MED ORDER — CLONAZEPAM 0.5 MG PO TABS: 0.5000 mg | ORAL_TABLET | Freq: Two times a day (BID) | ORAL | 0 refills | Status: DC | PRN

## 2022-07-21 NOTE — Progress Notes (Signed)
   Subjective:    Patient ID: Natasha Fernandez, female    DOB: 07-Aug-1986, 36 y.o.   MRN: [id number]  HPI CPE- UTD on pap, Tdap.  No concerns today.  Patient Care Team    Relationship Specialty Notifications Start End  Midge Minium, MD PCP - General Family Medicine  04/06/11   Paula Compton, MD Consulting Physician Obstetrics and Gynecology  05/29/19     Health Maintenance  Topic Date Due   TETANUS/TDAP  08/23/2022   PAP SMEAR-Modifier  07/14/2024   Hepatitis C Screening  Completed   HIV Screening  Completed   HPV VACCINES  Aged Out   COVID-19 Vaccine  Discontinued      Review of Systems Patient reports no vision/ hearing changes, adenopathy,fever, weight change,  persistant/recurrent hoarseness , swallowing issues, chest pain, palpitations, edema, persistant/recurrent cough, hemoptysis, dyspnea (rest/exertional/paroxysmal nocturnal), gastrointestinal bleeding (melena, rectal bleeding), abdominal pain, significant heartburn, bowel changes, GU symptoms (dysuria, hematuria, incontinence), Gyn symptoms (abnormal  bleeding, pain),  syncope, focal weakness, memory loss, numbness & tingling, skin/hair/nail changes, abnormal bruising or bleeding, anxiety, or depression.     Objective:   Physical Exam General Appearance:    Alert, cooperative, no distress, appears stated age  Head:    Normocephalic, without obvious abnormality, atraumatic  Eyes:    PERRL, conjunctiva/corneas clear, EOM's intact both eyes  Ears:    Normal TM's and external ear canals, both ears  Nose:   Nares normal, septum midline, mucosa normal, no drainage    or sinus tenderness  Throat:   Lips, mucosa, and tongue normal; teeth and gums normal  Neck:   Supple, symmetrical, trachea midline, no adenopathy;    Thyroid: no enlargement/tenderness/nodules  Back:     Symmetric, no curvature, ROM normal, no CVA tenderness  Lungs:     Clear to auscultation bilaterally, respirations unlabored  Chest Wall:    No  tenderness or deformity   Heart:    Regular rate and rhythm, S1 and S2 normal, no murmur, rub   or gallop  Breast Exam:    Deferred to GYN  Abdomen:     Soft, non-tender, bowel sounds active all four quadrants,    no masses, no organomegaly  Genitalia:    Deferred to GYN  Rectal:    Extremities:   Extremities normal, atraumatic, no cyanosis or edema  Pulses:   2+ and symmetric all extremities  Skin:   Skin color, texture, turgor normal, no rashes or lesions  Lymph nodes:   Cervical, supraclavicular, and axillary nodes normal  Neurologic:   CNII-XII intact, normal strength, sensation and reflexes    throughout          Assessment & Plan:

## 2022-07-21 NOTE — Assessment & Plan Note (Signed)
Check labs and replete prn. 

## 2022-07-21 NOTE — Assessment & Plan Note (Signed)
Pt's PE WNL w/ exception of BMI.  UTD on pap, Tdap.  Check labs.  Anticipatory guidance provided.

## 2022-07-21 NOTE — Assessment & Plan Note (Signed)
Pt is down 15 lbs!  Applauded her efforts.  Check labs to risk stratify.  Will follow.

## 2022-07-21 NOTE — Patient Instructions (Addendum)
Follow up in 1 year or as needed We'll notify you of your lab results and make any changes if needed Continue to work on healthy diet and regular exercise- you're doing great!!! Call with any questions or concerns Stay Safe! Stay Healthy! Happy Fall!!!

## 2022-07-22 ENCOUNTER — Other Ambulatory Visit: Payer: Self-pay

## 2022-07-22 DIAGNOSIS — E876 Hypokalemia: Secondary | ICD-10-CM

## 2022-07-22 DIAGNOSIS — D649 Anemia, unspecified: Secondary | ICD-10-CM

## 2022-07-22 DIAGNOSIS — E559 Vitamin D deficiency, unspecified: Secondary | ICD-10-CM

## 2022-07-22 MED ORDER — VITAMIN D (ERGOCALCIFEROL) 1.25 MG (50000 UNIT) PO CAPS: 50000.0000 [IU] | ORAL_CAPSULE | ORAL | 12 refills | Status: DC

## 2022-07-22 NOTE — Progress Notes (Signed)
Informed pt of lab results and sent in her Vitamin d  We have made her repeat lab visits

## 2022-07-25 ENCOUNTER — Telehealth: Payer: Self-pay | Admitting: Family Medicine

## 2022-07-25 ENCOUNTER — Other Ambulatory Visit: Payer: Self-pay

## 2022-07-25 DIAGNOSIS — E876 Hypokalemia: Secondary | ICD-10-CM

## 2022-07-25 DIAGNOSIS — F4323 Adjustment disorder with mixed anxiety and depressed mood: Secondary | ICD-10-CM | POA: Diagnosis not present

## 2022-07-25 MED ORDER — POTASSIUM CHLORIDE CRYS ER 20 MEQ PO TBCR: 20.0000 meq | EXTENDED_RELEASE_TABLET | Freq: Every day | ORAL | 3 refills | Status: DC

## 2022-07-25 NOTE — Telephone Encounter (Signed)
Informed pt that the Kdur has been sent in the pharmacy

## 2022-07-25 NOTE — Telephone Encounter (Signed)
Initial Comment Caller states that she saw the doctor yesterday, her potassium is low on her lab results and medication was to be called in for potassium- [facility] pharmacy has not received it. She is due for more lab work in 7 days. No sx. Translation No Nurse Assessment Nurse: DIdolina Primer RN, Tiffani Date/Time (Eastern Time): 07/22/2022 7:39:54 PM Confirm and document reason for call. If symptomatic, describe symptoms. ---Caller states that she saw the doctor yesterday, her potassium is low on her lab results and medication was to be called in for potassium- Kdur 271m- they pharmacy has not received it. She is due for more lab work in 7 days. Caller reports she is having leg cramps. Denies new or worsening symptoms. Does the patient have any new or worsening symptoms? ---No Nurse: DuIdolina PrimerRN, Tiffani Date/Time (Eastern Time): 07/22/2022 7:41:05 PM Please select the assessment type ---RX called in but not at [facility] of the medication. ---Kdur 20 meq 1 tab daily for hypokalemia for k of 2.9 Pharmacy name and phone number. [facility] [place] [facility] [phone] [facility] [phone] the office closed within the last 30 minutes? ---No Does the client directives allow for assistance with medications after hours? ---Yes Is there an on-call physician for the client? ---Yes PLEASE NOTE: All timestamps contained within this report are represented as [place] Time. CONFIDENTIALTY NOTICE: This fax transmission is intended only for the addressee. It contains information that is legally privileged, confidential or otherwise protected from use or disclosure. If you are not the intended recipient, you are strictly prohibited from reviewing, disclosing, copying using or disseminating any of this information or taking any action in reliance on or regarding this information. If you have received this fax in error, please notify usKoreaimmediately by telephone so that we can arrange for its return to usKoreaPhone: [phone]-Free: [phone]: [phone]: 74 of 2 Call Id: [id number] Assessment Disp. Time (EEilene Ghaziime) Disposition Final User 07/22/2022 7:26:34 PM Send To Call Back Waiting For Nurse PeHollice Gong0/03/2022 7:33:49 PM Send To Clinical Follow Up QuRonna PolioRN, JoVonna Kotyk0/03/2022 7:56:17 PM Paged On Call back to [facility] LtdRN, Tiffani 07/22/2022 8:23:01 PM Called On-Call Provider Dunn, RN, Tiffani 07/22/2022 8:25:17 PM Clinical Call Yes Dunn, RN, Tiffani 07/22/2022 8:25:38 PM Pharmacy Call Dunn, RN, Tiffani Reason: Verified rx was not called in Final Disposition 07/22/2022 8:25:17 PM Clinical Call Yes Dunn, RN, Tiffani Comments User: TiBrantley FlingRN Date/Time (EEilene Ghaziime): 07/22/2022 8:24:29 PM notified caller that on call is calling in rx now. Paging DoctorName Phone DateTime Result/ Outcome Message Type Notes AnBilley ChangMD [phone]/03/2022 7:56:17 PM Paged On Call Back to Call Center Doctor Paged AnBilley ChangMD [phone]/03/2022 8:23:01 PM Called On Call Provider - Reached Doctor Paged AnBilley ChangMD 07/22/2022 8:23:42 PM AnBilley ChangMD 07/22/2022 8:23:42 PM Spoke with On Call - General Message Result On call states she will call the rx

## 2022-08-01 DIAGNOSIS — E890 Postprocedural hypothyroidism: Secondary | ICD-10-CM | POA: Diagnosis not present

## 2022-08-01 DIAGNOSIS — C73 Malignant neoplasm of thyroid gland: Secondary | ICD-10-CM | POA: Diagnosis not present

## 2022-08-01 DIAGNOSIS — Z79899 Other long term (current) drug therapy: Secondary | ICD-10-CM | POA: Diagnosis not present

## 2022-08-03 ENCOUNTER — Other Ambulatory Visit (INDEPENDENT_AMBULATORY_CARE_PROVIDER_SITE_OTHER): Payer: BC Managed Care – PPO

## 2022-08-03 DIAGNOSIS — D649 Anemia, unspecified: Secondary | ICD-10-CM

## 2022-08-03 DIAGNOSIS — E876 Hypokalemia: Secondary | ICD-10-CM | POA: Diagnosis not present

## 2022-08-03 LAB — BASIC METABOLIC PANEL
BUN: 12 mg/dL (ref 6–23)
CO2: 23 mEq/L (ref 19–32)
Calcium: 9.1 mg/dL (ref 8.4–10.5)
Chloride: 105 mEq/L (ref 96–112)
Creatinine, Ser: 0.84 mg/dL (ref 0.40–1.20)
GFR: 88.24 mL/min (ref >60.00)
Glucose, Bld: 94 mg/dL (ref 70–99)
Potassium: 4.3 mEq/L (ref 3.5–5.1)
Sodium: 137 mEq/L (ref 135–145)

## 2022-08-04 LAB — IRON,TIBC AND FERRITIN PANEL
%SAT: 9 % (calc) — ABNORMAL LOW (ref 16–45)
Ferritin: 12 ng/mL — ABNORMAL LOW (ref 16–154)
Iron: 36 ug/dL — ABNORMAL LOW (ref 40–190)
TIBC: 385 mcg/dL (calc) (ref 250–450)

## 2022-08-04 NOTE — Progress Notes (Signed)
Pt seen results Via my chart  

## 2022-08-08 DIAGNOSIS — F4323 Adjustment disorder with mixed anxiety and depressed mood: Secondary | ICD-10-CM | POA: Diagnosis not present

## 2022-08-15 DIAGNOSIS — F4323 Adjustment disorder with mixed anxiety and depressed mood: Secondary | ICD-10-CM | POA: Diagnosis not present

## 2022-08-22 DIAGNOSIS — F4323 Adjustment disorder with mixed anxiety and depressed mood: Secondary | ICD-10-CM | POA: Diagnosis not present

## 2022-08-23 ENCOUNTER — Other Ambulatory Visit: Payer: Self-pay

## 2022-08-23 ENCOUNTER — Other Ambulatory Visit: Payer: BC Managed Care – PPO

## 2022-08-23 DIAGNOSIS — D649 Anemia, unspecified: Secondary | ICD-10-CM

## 2022-08-23 DIAGNOSIS — E876 Hypokalemia: Secondary | ICD-10-CM

## 2022-08-24 ENCOUNTER — Telehealth: Payer: Self-pay

## 2022-08-24 LAB — IRON,TIBC AND FERRITIN PANEL
%SAT: 17 % (calc) (ref 16–45)
Ferritin: 8 ng/mL — ABNORMAL LOW (ref 16–154)
Iron: 60 ug/dL (ref 40–190)
TIBC: 343 mcg/dL (calc) (ref 250–450)

## 2022-08-24 NOTE — Telephone Encounter (Signed)
-----   Message from Midge Minium, MD sent at 08/24/2022  7:32 AM EST ----- Iron is definitely improving w/ the iron supplement.  Please continue

## 2022-08-24 NOTE — Telephone Encounter (Signed)
Informed pt of lab result 

## 2022-08-29 DIAGNOSIS — F4323 Adjustment disorder with mixed anxiety and depressed mood: Secondary | ICD-10-CM | POA: Diagnosis not present

## 2022-09-05 DIAGNOSIS — F4323 Adjustment disorder with mixed anxiety and depressed mood: Secondary | ICD-10-CM | POA: Diagnosis not present

## 2022-09-12 DIAGNOSIS — F4323 Adjustment disorder with mixed anxiety and depressed mood: Secondary | ICD-10-CM | POA: Diagnosis not present

## 2022-09-19 DIAGNOSIS — F4323 Adjustment disorder with mixed anxiety and depressed mood: Secondary | ICD-10-CM | POA: Diagnosis not present

## 2022-10-03 DIAGNOSIS — F4323 Adjustment disorder with mixed anxiety and depressed mood: Secondary | ICD-10-CM | POA: Diagnosis not present

## 2022-10-09 ENCOUNTER — Ambulatory Visit: Admit: 2022-10-09 | Discharge status: Absent without leave | Payer: BC Managed Care – PPO

## 2022-10-15 DIAGNOSIS — Z6830 Body mass index (BMI) 30.0-30.9, adult: Secondary | ICD-10-CM | POA: Diagnosis not present

## 2022-10-15 DIAGNOSIS — J0120 Acute ethmoidal sinusitis, unspecified: Secondary | ICD-10-CM | POA: Diagnosis not present

## 2022-10-24 DIAGNOSIS — F4323 Adjustment disorder with mixed anxiety and depressed mood: Secondary | ICD-10-CM | POA: Diagnosis not present

## 2022-10-27 ENCOUNTER — Ambulatory Visit (INDEPENDENT_AMBULATORY_CARE_PROVIDER_SITE_OTHER): Payer: BC Managed Care – PPO | Admitting: Family Medicine

## 2022-10-27 ENCOUNTER — Encounter: Payer: Self-pay | Admitting: Family Medicine

## 2022-10-27 VITALS — BP 116/68 | HR 75 | Temp 98.8°F | Ht 65.0 in | Wt 189.4 lb

## 2022-10-27 DIAGNOSIS — J029 Acute pharyngitis, unspecified: Secondary | ICD-10-CM

## 2022-10-27 LAB — POCT RAPID STREP A (OFFICE): Rapid Strep A Screen: NEGATIVE

## 2022-10-27 LAB — POC COVID19 BINAXNOW: SARS Coronavirus 2 Ag: NEGATIVE

## 2022-10-27 MED ORDER — AMOXICILLIN 500 MG PO CAPS: 500.0000 mg | ORAL_CAPSULE | Freq: Two times a day (BID) | ORAL | 0 refills | Status: AC

## 2022-10-27 NOTE — Patient Instructions (Signed)
Continue sore throat lozenges, fluids, see other information below on sore throat care.  With worsening symptoms I think it is reasonable to treat with amoxicillin but strep testing and COVID testing were negative here in the office today.  As we discussed other viruses can cause sore throat including mono or CMV, but based on your exam today I think we can hold off on further testing.  Hope you feel better soon.  Let us know if there are questions.  Sore Throat A sore throat is pain, burning, irritation, or scratchiness in the throat. When you have a sore throat, you may feel pain or tenderness in your throat when you swallow or talk. Many things can cause a sore throat, including: An infection. Seasonal allergies. Dryness in the air. Irritants, such as smoke or pollution. Radiation treatment for cancer. Gastroesophageal reflux disease (GERD). A tumor. A sore throat is often the first sign of another sickness. It may happen with other symptoms, such as coughing, sneezing, fever, and swollen neck glands. Most sore throats go away without medical treatment. Follow these instructions at home:     Medicines Take over-the-counter and prescription medicines only as told by your health care provider. Children often get sore throats. Do not give your child aspirin because of the association with Reye's syndrome. Use throat sprays to soothe your throat as told by your health care provider. Managing pain To help with pain, try: Sipping warm liquids, such as broth, herbal tea, or warm water. Eating or drinking cold or frozen liquids, such as frozen ice pops. Gargling with a mixture of salt and water 3-4 times a day or as needed. To make salt water, completely dissolve -1 tsp (3-6 g) of salt in 1 cup (237 mL) of warm water. Sucking on hard candy or throat lozenges. Putting a cool-mist humidifier in your bedroom at night to moisten the air. Sitting in the bathroom with the door closed for 5-10  minutes while you run hot water in the shower. General instructions Do not use any products that contain nicotine or tobacco. These products include cigarettes, chewing tobacco, and vaping devices, such as e-cigarettes. If you need help quitting, ask your health care provider. Rest as needed. Drink enough fluid to keep your urine pale yellow. Wash your hands often with soap and water for at least 20 seconds. If soap and water are not available, use hand sanitizer. Contact a health care provider if: You have a fever for more than 2-3 days. You have symptoms that last for more than 2-3 days. Your throat does not get better within 7 days. You have a fever and your symptoms suddenly get worse. Get help right away if: You have difficulty breathing. You cannot swallow fluids, soft foods, or your saliva. You have increased swelling in your throat or neck. You have persistent nausea and vomiting. These symptoms may represent a serious problem that is an emergency. Do not wait to see if the symptoms will go away. Get medical help right away. Call your local emergency services (911 in the [place] Do not drive yourself to the hospital. Summary A sore throat is pain, burning, irritation, or scratchiness in the throat. Many things can cause a sore throat. Take over-the-counter medicines only as told by your health care provider. Rest as needed. Drink enough fluid to keep your urine pale yellow. Contact a health care provider if your throat does not get better within 7 days. This information is not intended to replace advice given to  you by your health care provider. Make sure you discuss any questions you have with your health care provider. Document Revised: 12/30/2020 Document Reviewed: 12/30/2020 Elsevier Patient Education  [place].

## 2022-10-27 NOTE — Progress Notes (Signed)
Subjective:  Patient ID: Natasha Fernandez, female    DOB: [birth year]  Age: 37 y.o. MRN: [id number]  CC:  Chief Complaint  Patient presents with   Sore Throat    Started 12/24, [facility] and given tamiflu. C/o fever and headache. Denies body aches, chills, n/v/d. Completed 2 home covid test on 12/24 and 12/26, both were negative.    HPI Natasha Fernandez presents for   Sore throat As above, initially started 10/09/2022 - ST, fever, body ache, chills, treated with Tamiflu at that time as fever, headache - telemed visit on 12/24. 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 Seen by [facility] 12/30 - treated with 6 day prednisone taper. Voice improved, but persistent sore throat.  Worse sore throat past 2-3 days. Yesterday worse. Able to work yesterday - more talking yesterday. Subjective return of fever 2 days ago, then 100.0 yesterday, restarted advil/tylenol.  Swollen lymph nodes in neck past 5 days. Lump feeling to swallow. White spots on left past few days.  No abx allergies.  No rash.  No known sick contacts, but coworker's son has mono.  Tx: warm compresses, tea, micronebulizer per vocal cord - ENT at [facility] recommended. Decongestant - causes too much drying. Expectorant- min relief.  Some slight cough past few days- slight PND.  No dyspnea/chest pain. No chest congestion.  Able to clear secretions and drink fluids.  Negative COVID test as above, most recently 12/26.   History Patient Active Problem List   Diagnosis Date Noted   Obesity (BMI 30-39.9) 05/29/2019   Vitamin D deficiency 05/29/2019   History of thyroid cancer 02/08/2017   General medical examination 03/21/2011   Past Medical History:  Diagnosis Date   Anemia    hx   Complication of anesthesia    Family history of adverse reaction to anesthesia    mom nausea and vomiting   GERD (gastroesophageal reflux disease)    Hay fever    History of chicken pox    Hypertension    IBS (irritable  bowel syndrome)    Migraine    hx   PONV (postoperative nausea and vomiting)    Past Surgical History:  Procedure Laterality Date   CHOLECYSTECTOMY  2008   THYROIDECTOMY  02/08/2017   THYROIDECTOMY N/A 02/08/2017   Procedure: TOTAL THYROIDECTOMY;  Surgeon: Izora Gala, MD;  Location: [facility] OR;  Service: ENT;  Laterality: N/A;   Allergies  Allergen Reactions   Haemophilus B Polysaccharide Vaccine Anaphylaxis   Influenza Vaccine Live Anaphylaxis   Other Anaphylaxis   Prior to Admission medications   Medication Sig Start Date End Date Taking? Authorizing Provider  clonazePAM (KLONOPIN) 0.5 MG tablet Take 1 tablet (0.5 mg total) by mouth 2 (two) times daily as needed for anxiety. 07/21/22  Yes Midge Minium, MD  levothyroxine (SYNTHROID) 125 MCG tablet Take by mouth daily before breakfast.  03/12/19  Yes [provider]  valACYclovir (VALTREX) 1000 MG tablet 2 tabs q12 x2 doses at first sign of cold sores 07/14/21  Yes Midge Minium, MD  Vitamin D, Ergocalciferol, (DRISDOL) 1.25 MG (50000 UNIT) CAPS capsule Take 1 capsule (50,000 Units total) by mouth every 7 (seven) days. 07/22/22  Yes Midge Minium, MD   Social History   Socioeconomic History   Marital status: Married    Spouse name: Not on file   Number of children: Not on file   Years of education: Not on file   Highest education  level: Not on file  Occupational History   Not on file  Tobacco Use   Smoking status: Never   Smokeless tobacco: Never  Substance and Sexual Activity   Alcohol use: Yes    Comment: rare   Drug use: No   Sexual activity: Yes    Birth control/protection: None  Other Topics Concern   Not on file  Social History Narrative   Not on file   Social Determinants of Health   Financial Resource Strain: Not on file  Food Insecurity: Not on file  Transportation Needs: Not on file  Physical Activity: Not on file  Stress: Not on file  Social Connections: Not on file  Intimate  Partner Violence: Not on file    Review of Systems Per HPI.   Objective:   Vitals:   10/27/22 1305  BP: 116/68  Pulse: 75  Temp: 98.8 F (37.1 C)  SpO2: 99%  Weight: 189 lb 6.4 oz (85.9 kg)  Height: '5\' 5"'$  (1.651 m)    Physical Exam Vitals reviewed.  Constitutional:      General: She is not in acute distress.    Appearance: She is well-developed.  HENT:     Head: Normocephalic and atraumatic.     Right Ear: Hearing, tympanic membrane, ear canal and external ear normal.     Left Ear: Hearing, tympanic membrane, ear canal and external ear normal.     Nose: Nose normal.     Comments: Sinuses nontender    Mouth/Throat:     Pharynx: Posterior oropharyngeal erythema (Left tonsillar, peritonsillar area.  No exudate, few small white pinpoint spots on left.  Uvula midline without shift.  No posterior oropharynx exudate.) present.     Tonsils: No tonsillar exudate.  Eyes:     Conjunctiva/sclera: Conjunctivae normal.     Pupils: Pupils are equal, round, and reactive to light.  Cardiovascular:     Rate and Rhythm: Normal rate and regular rhythm.     Heart sounds: Normal heart sounds. No murmur heard. Pulmonary:     Effort: Pulmonary effort is normal. No respiratory distress.     Breath sounds: Normal breath sounds. No wheezing or rhonchi.  Lymphadenopathy:     Cervical: Cervical adenopathy (AC noted tender on left, minimal enlargement.  Right nontender.  Supra vascular, axillary, epitrochlear nodes nonpalpable, nontender bilaterally.) present.  Skin:    General: Skin is warm and dry.     Findings: No rash.  Neurological:     Mental Status: She is alert and oriented to person, place, and time.  Psychiatric:        Mood and Affect: Mood normal.        Behavior: Behavior normal.    Results for orders placed or performed in visit on 10/27/22  POC COVID-19 BinaxNow  Result Value Ref Range   SARS Coronavirus 2 Ag Negative Negative  POCT rapid strep A  Result Value Ref Range    Rapid Strep A Screen Negative Negative       Assessment & Plan:  Natasha Fernandez is a 37 y.o. female . Pharyngitis, unspecified etiology - Plan: POC COVID-19 BinaxNow, POCT rapid strep A, amoxicillin (AMOXIL) 500 MG capsule  Possible initial viral illness with secondary symptoms/secondary infection versus worsening from initial infection.  Status post treatment as above including Tamiflu for initial illness and prednisone with laryngitis and history of vocal cord paralysis.  Erythema of posterior oropharynx on left with possible reactive lymphadenopathy, negative rapid strep testing, differential includes false  negative versus other viral illness including CMV or mono.  Will start amoxicillin, potential side effects discussed, follow-up if rash occurs, would consider mono. Symptomatic care with RTC precautions. Meds ordered this encounter  Medications   amoxicillin (AMOXIL) 500 MG capsule    Sig: Take 1 capsule (500 mg total) by mouth 2 (two) times daily for 10 days.    Dispense:  20 capsule    Refill:  0   Patient Instructions  Continue sore throat lozenges, fluids, see other information below on sore throat care.  With worsening symptoms I think it is reasonable to treat with amoxicillin but strep testing and COVID testing were negative here in the office today.  As we discussed other viruses can cause sore throat including mono or CMV, but based on your exam today I think we can hold off on further testing.  Hope you feel better soon.  Let us know if there are questions.  Sore Throat A sore throat is pain, burning, irritation, or scratchiness in the throat. When you have a sore throat, you may feel pain or tenderness in your throat when you swallow or talk. Many things can cause a sore throat, including: An infection. Seasonal allergies. Dryness in the air. Irritants, such as smoke or pollution. Radiation treatment for cancer. Gastroesophageal reflux disease (GERD). A tumor. A sore  throat is often the first sign of another sickness. It may happen with other symptoms, such as coughing, sneezing, fever, and swollen neck glands. Most sore throats go away without medical treatment. Follow these instructions at home:     Medicines Take over-the-counter and prescription medicines only as told by your health care provider. Children often get sore throats. Do not give your child aspirin because of the association with Reye's syndrome. Use throat sprays to soothe your throat as told by your health care provider. Managing pain To help with pain, try: Sipping warm liquids, such as broth, herbal tea, or warm water. Eating or drinking cold or frozen liquids, such as frozen ice pops. Gargling with a mixture of salt and water 3-4 times a day or as needed. To make salt water, completely dissolve -1 tsp (3-6 g) of salt in 1 cup (237 mL) of warm water. Sucking on hard candy or throat lozenges. Putting a cool-mist humidifier in your bedroom at night to moisten the air. Sitting in the bathroom with the door closed for 5-10 minutes while you run hot water in the shower. General instructions Do not use any products that contain nicotine or tobacco. These products include cigarettes, chewing tobacco, and vaping devices, such as e-cigarettes. If you need help quitting, ask your health care provider. Rest as needed. Drink enough fluid to keep your urine pale yellow. Wash your hands often with soap and water for at least 20 seconds. If soap and water are not available, use hand sanitizer. Contact a health care provider if: You have a fever for more than 2-3 days. You have symptoms that last for more than 2-3 days. Your throat does not get better within 7 days. You have a fever and your symptoms suddenly get worse. Get help right away if: You have difficulty breathing. You cannot swallow fluids, soft foods, or your saliva. You have increased swelling in your throat or neck. You have  persistent nausea and vomiting. These symptoms may represent a serious problem that is an emergency. Do not wait to see if the symptoms will go away. Get medical help right away. Call your local emergency  services (911 in the [place] Do not drive yourself to the hospital. Summary A sore throat is pain, burning, irritation, or scratchiness in the throat. Many things can cause a sore throat. Take over-the-counter medicines only as told by your health care provider. Rest as needed. Drink enough fluid to keep your urine pale yellow. Contact a health care provider if your throat does not get better within 7 days. This information is not intended to replace advice given to you by your health care provider. Make sure you discuss any questions you have with your health care provider. Document Revised: 12/30/2020 Document Reviewed: 12/30/2020 Elsevier Patient Education  Driscoll,   Merri Ray, MD [place] 10/27/22 1:47 PM

## 2022-11-07 DIAGNOSIS — F4323 Adjustment disorder with mixed anxiety and depressed mood: Secondary | ICD-10-CM | POA: Diagnosis not present

## 2022-11-21 DIAGNOSIS — F4323 Adjustment disorder with mixed anxiety and depressed mood: Secondary | ICD-10-CM | POA: Diagnosis not present

## 2022-11-23 ENCOUNTER — Encounter (INDEPENDENT_AMBULATORY_CARE_PROVIDER_SITE_OTHER): Payer: BC Managed Care – PPO | Admitting: Family Medicine

## 2022-11-23 DIAGNOSIS — U071 COVID-19: Secondary | ICD-10-CM

## 2022-11-23 MED ORDER — NIRMATRELVIR/RITONAVIR (PAXLOVID)TABLET: 3.0000 | ORAL_TABLET | Freq: Two times a day (BID) | ORAL | 0 refills | Status: AC

## 2022-11-23 NOTE — Telephone Encounter (Signed)
[facility] VISIT   Patient agreed to [facility] visit and is aware that copayment and coinsurance may apply. Patient was treated using telemedicine according to accepted telemedicine protocols.  Subjective:   Patient complains of COVID  Patient Active Problem List   Diagnosis Date Noted   Obesity (BMI 30-39.9) 05/29/2019   Vitamin D deficiency 05/29/2019   History of thyroid cancer 02/08/2017   General medical examination 03/21/2011   Social History   Tobacco Use   Smoking status: Never   Smokeless tobacco: Never  Substance Use Topics   Alcohol use: Yes    Comment: rare    Current Outpatient Medications:    nirmatrelvir/ritonavir (PAXLOVID) 20 x 150 MG & 10 x '100MG'$  TABS, Take 3 tablets by mouth 2 (two) times daily for 5 days. (Take nirmatrelvir 150 mg two tablets twice daily for 5 days and ritonavir 100 mg one tablet twice daily for 5 days) Patient GFR is 88, Disp: 30 tablet, Rfl: 0   clonazePAM (KLONOPIN) 0.5 MG tablet, Take 1 tablet (0.5 mg total) by mouth 2 (two) times daily as needed for anxiety., Disp: 20 tablet, Rfl: 0   levothyroxine (SYNTHROID) 125 MCG tablet, Take by mouth daily before breakfast. , Disp: , Rfl:    valACYclovir (VALTREX) 1000 MG tablet, 2 tabs q12 x2 doses at first sign of cold sores, Disp: 32 tablet, Rfl: 1   Vitamin D, Ergocalciferol, (DRISDOL) 1.25 MG (50000 UNIT) CAPS capsule, Take 1 capsule (50,000 Units total) by mouth every 7 (seven) days., Disp: 7 capsule, Rfl: 12 No current facility-administered medications for this visit.  Facility-Administered Medications Ordered in Other Visits:    TDaP (BOOSTRIX) injection 0.5 mL, 0.5 mL, Intramuscular, Once, Birdie Riddle Aundra Millet, MD  Allergies  Allergen Reactions   Haemophilus B Polysaccharide Vaccine Anaphylaxis   Influenza Vaccine Live Anaphylaxis   Other Anaphylaxis    Assessment and Plan:   Diagnosis: COVID. Please see myChart communication and orders below.   No orders of the defined types were  placed in this encounter.  Meds ordered this encounter  Medications   nirmatrelvir/ritonavir (PAXLOVID) 20 x 150 MG & 10 x '100MG'$  TABS    Sig: Take 3 tablets by mouth 2 (two) times daily for 5 days. (Take nirmatrelvir 150 mg two tablets twice daily for 5 days and ritonavir 100 mg one tablet twice daily for 5 days) Patient GFR is 88    Dispense:  30 tablet    Refill:  0    Annye Asa, MD 11/23/2022  A total of 6 minutes were spent by me to personally review the patient-generated inquiry, review patient records and data pertinent to assessment of the patient's problem, develop a management plan including generation of prescriptions and/or orders, and on subsequent communication with the patient through secure the MyChart portal service.   There is no separately reported E/M service related to this service in the past 7 days nor does the patient have an upcoming soonest available appointment for this issue. This work was completed in less than 7 days.   The patient consented to this service today (see patient agreement prior to ongoing communication). Patient counseled regarding the need for in-person exam for certain conditions and was advised to call the office if any changing or worsening symptoms occur.   The codes to be used for the E/M service are: '[x]'$   99421 for 5-10 minutes of time spent on the inquiry. '[]'$   [id number] for 11-20 minutes. '[]'$   [id number] for 21+ minutes.

## 2022-12-05 DIAGNOSIS — F4323 Adjustment disorder with mixed anxiety and depressed mood: Secondary | ICD-10-CM | POA: Diagnosis not present

## 2022-12-12 DIAGNOSIS — F4323 Adjustment disorder with mixed anxiety and depressed mood: Secondary | ICD-10-CM | POA: Diagnosis not present

## 2022-12-19 DIAGNOSIS — F4323 Adjustment disorder with mixed anxiety and depressed mood: Secondary | ICD-10-CM | POA: Diagnosis not present

## 2022-12-26 DIAGNOSIS — F4323 Adjustment disorder with mixed anxiety and depressed mood: Secondary | ICD-10-CM | POA: Diagnosis not present

## 2022-12-27 DIAGNOSIS — Z86018 Personal history of other benign neoplasm: Secondary | ICD-10-CM | POA: Diagnosis not present

## 2022-12-27 DIAGNOSIS — L814 Other melanin hyperpigmentation: Secondary | ICD-10-CM | POA: Diagnosis not present

## 2022-12-27 DIAGNOSIS — D229 Melanocytic nevi, unspecified: Secondary | ICD-10-CM | POA: Diagnosis not present

## 2022-12-27 DIAGNOSIS — L578 Other skin changes due to chronic exposure to nonionizing radiation: Secondary | ICD-10-CM | POA: Diagnosis not present

## 2023-01-23 DIAGNOSIS — F4323 Adjustment disorder with mixed anxiety and depressed mood: Secondary | ICD-10-CM | POA: Diagnosis not present

## 2023-01-30 DIAGNOSIS — F4323 Adjustment disorder with mixed anxiety and depressed mood: Secondary | ICD-10-CM | POA: Diagnosis not present

## 2023-02-13 DIAGNOSIS — F4323 Adjustment disorder with mixed anxiety and depressed mood: Secondary | ICD-10-CM | POA: Diagnosis not present

## 2023-02-16 DIAGNOSIS — N9489 Other specified conditions associated with female genital organs and menstrual cycle: Secondary | ICD-10-CM | POA: Diagnosis not present

## 2023-02-16 DIAGNOSIS — Z1389 Encounter for screening for other disorder: Secondary | ICD-10-CM | POA: Diagnosis not present

## 2023-02-16 DIAGNOSIS — E039 Hypothyroidism, unspecified: Secondary | ICD-10-CM | POA: Diagnosis not present

## 2023-02-16 DIAGNOSIS — Z01419 Encounter for gynecological examination (general) (routine) without abnormal findings: Secondary | ICD-10-CM | POA: Diagnosis not present

## 2023-02-20 DIAGNOSIS — F4323 Adjustment disorder with mixed anxiety and depressed mood: Secondary | ICD-10-CM | POA: Diagnosis not present

## 2023-02-27 DIAGNOSIS — F4323 Adjustment disorder with mixed anxiety and depressed mood: Secondary | ICD-10-CM | POA: Diagnosis not present

## 2023-03-27 DIAGNOSIS — F4323 Adjustment disorder with mixed anxiety and depressed mood: Secondary | ICD-10-CM | POA: Diagnosis not present

## 2023-04-03 DIAGNOSIS — F4323 Adjustment disorder with mixed anxiety and depressed mood: Secondary | ICD-10-CM | POA: Diagnosis not present

## 2023-04-10 DIAGNOSIS — F4323 Adjustment disorder with mixed anxiety and depressed mood: Secondary | ICD-10-CM | POA: Diagnosis not present

## 2023-06-04 ENCOUNTER — Encounter: Payer: Self-pay | Admitting: Family Medicine

## 2023-06-05 MED ORDER — METHOCARBAMOL 500 MG PO TABS
500.0000 mg | ORAL_TABLET | Freq: Three times a day (TID) | ORAL | 0 refills | Status: DC | PRN
Start: 2023-06-05 — End: 2023-07-26

## 2023-06-05 NOTE — Addendum Note (Signed)
Addended by: Sheliah Hatch on: 06/05/2023 10:10 AM   Modules accepted: Orders

## 2023-06-06 ENCOUNTER — Encounter: Payer: Self-pay | Admitting: Family Medicine

## 2023-06-06 MED ORDER — LEVOTHYROXINE SODIUM 125 MCG PO TABS
ORAL_TABLET | ORAL | 0 refills | Status: DC
Start: 2023-06-06 — End: 2023-08-21

## 2023-06-06 NOTE — Telephone Encounter (Signed)
Pend Levothyroxine, pt notes during last visit you both discussed transferring follow up/care of treatment from Endo to you   Okay to send med?

## 2023-06-07 DIAGNOSIS — M542 Cervicalgia: Secondary | ICD-10-CM | POA: Diagnosis not present

## 2023-06-08 DIAGNOSIS — M542 Cervicalgia: Secondary | ICD-10-CM | POA: Diagnosis not present

## 2023-07-26 ENCOUNTER — Ambulatory Visit: Payer: BC Managed Care – PPO | Admitting: Family Medicine

## 2023-07-26 ENCOUNTER — Encounter: Payer: Self-pay | Admitting: Family Medicine

## 2023-07-26 VITALS — BP 110/78 | HR 87 | Temp 98.5°F | Ht 65.0 in | Wt 191.2 lb

## 2023-07-26 DIAGNOSIS — Z0000 Encounter for general adult medical examination without abnormal findings: Secondary | ICD-10-CM | POA: Diagnosis not present

## 2023-07-26 DIAGNOSIS — E669 Obesity, unspecified: Secondary | ICD-10-CM | POA: Diagnosis not present

## 2023-07-26 DIAGNOSIS — Z85850 Personal history of malignant neoplasm of thyroid: Secondary | ICD-10-CM | POA: Diagnosis not present

## 2023-07-26 DIAGNOSIS — Z23 Encounter for immunization: Secondary | ICD-10-CM | POA: Diagnosis not present

## 2023-07-26 DIAGNOSIS — E559 Vitamin D deficiency, unspecified: Secondary | ICD-10-CM | POA: Diagnosis not present

## 2023-07-26 LAB — CBC WITH DIFFERENTIAL/PLATELET
Basophils Absolute: 0 10*3/uL (ref 0.0–0.1)
Basophils Relative: 0.4 % (ref 0.0–3.0)
Eosinophils Absolute: 0.1 10*3/uL (ref 0.0–0.7)
Eosinophils Relative: 1.7 % (ref 0.0–5.0)
HCT: 39 % (ref 36.0–46.0)
Hemoglobin: 12.6 g/dL (ref 12.0–15.0)
Lymphocytes Relative: 13.5 % (ref 12.0–46.0)
Lymphs Abs: 1 10*3/uL (ref 0.7–4.0)
MCHC: 32.3 g/dL (ref 30.0–36.0)
MCV: 87.7 fL (ref 78.0–100.0)
Monocytes Absolute: 1 10*3/uL (ref 0.1–1.0)
Monocytes Relative: 14.3 % — ABNORMAL HIGH (ref 3.0–12.0)
Neutro Abs: 5.1 10*3/uL (ref 1.4–7.7)
Neutrophils Relative %: 70.1 % (ref 43.0–77.0)
Platelets: 339 10*3/uL (ref 150.0–400.0)
RBC: 4.45 Mil/uL (ref 3.87–5.11)
RDW: 14.2 % (ref 11.5–15.5)
WBC: 7.2 10*3/uL (ref 4.0–10.5)

## 2023-07-26 LAB — LIPID PANEL
Cholesterol: 147 mg/dL (ref 0–200)
HDL: 52.7 mg/dL (ref >39.00)
LDL Cholesterol: 79 mg/dL (ref 0–99)
NonHDL: 94.17
Total CHOL/HDL Ratio: 3
Triglycerides: 74 mg/dL (ref 0.0–149.0)
VLDL: 14.8 mg/dL (ref 0.0–40.0)

## 2023-07-26 LAB — TSH: TSH: 0.42 u[IU]/mL (ref 0.35–5.50)

## 2023-07-26 LAB — HEPATIC FUNCTION PANEL
ALT: 12 U/L (ref 0–35)
AST: 11 U/L (ref 0–37)
Albumin: 4.1 g/dL (ref 3.5–5.2)
Alkaline Phosphatase: 62 U/L (ref 39–117)
Bilirubin, Direct: 0.1 mg/dL (ref 0.0–0.3)
Total Bilirubin: 0.3 mg/dL (ref 0.2–1.2)
Total Protein: 6.7 g/dL (ref 6.0–8.3)

## 2023-07-26 LAB — BASIC METABOLIC PANEL
BUN: 12 mg/dL (ref 6–23)
CO2: 24 meq/L (ref 19–32)
Calcium: 9.1 mg/dL (ref 8.4–10.5)
Chloride: 106 meq/L (ref 96–112)
Creatinine, Ser: 0.75 mg/dL (ref 0.40–1.20)
GFR: 100.4 mL/min (ref >60.00)
Glucose, Bld: 95 mg/dL (ref 70–99)
Potassium: 4.1 meq/L (ref 3.5–5.1)
Sodium: 138 meq/L (ref 135–145)

## 2023-07-26 LAB — VITAMIN D 25 HYDROXY (VIT D DEFICIENCY, FRACTURES): VITD: 21.42 ng/mL — ABNORMAL LOW (ref 30.00–100.00)

## 2023-07-26 MED ORDER — VALACYCLOVIR HCL 1 G PO TABS: ORAL_TABLET | ORAL | 1 refills | Status: DC

## 2023-07-26 NOTE — Assessment & Plan Note (Signed)
Encouraged healthy diet and regular exercise.  Check labs to risk stratify.  Will follow. ?

## 2023-07-26 NOTE — Patient Instructions (Signed)
Follow up in 1 year or as needed We'll notify you of your lab results and make any changes if needed Keep up the good work on healthy diet and regular exercise- you can do it! Call with any questions or concerns Stay Safe!  Stay Healthy! Happy Fall!!

## 2023-07-26 NOTE — Progress Notes (Signed)
Subjective:    Patient ID: Natasha Fernandez, female    DOB: 01-25-1986, 37 y.o.   MRN: [id number]  HPI CPE- UTD on pap.  Due for Tdap.  Patient Care Team    Relationship Specialty Notifications Start End  Sheliah Hatch, MD PCP - General Family Medicine  04/06/11   Huel Cote, MD Consulting Physician Obstetrics and Gynecology  05/29/19   [facility]    07/26/23      Health Maintenance  Topic Date Due   DTaP/Tdap/Td (2 - Td or Tdap) 08/23/2022   Cervical Cancer Screening (HPV/Pap [facility])  07/14/2026   Hepatitis C Screening  Completed   HIV Screening  Completed   HPV VACCINES  Aged Out   COVID-19 Vaccine  Discontinued      Review of Systems Patient reports no vision/ hearing changes, adenopathy,fever, weight change,  persistant/recurrent hoarseness , swallowing issues, chest pain, palpitations, edema, persistant/recurrent cough, hemoptysis, dyspnea (rest/exertional/paroxysmal nocturnal), gastrointestinal bleeding (melena, rectal bleeding), abdominal pain, significant heartburn, bowel changes, GU symptoms (dysuria, hematuria, incontinence), Gyn symptoms (abnormal  bleeding, pain),  syncope, focal weakness, memory loss, numbness & tingling, skin/hair/nail changes, abnormal bruising or bleeding, anxiety, or depression.     Objective:   Physical Exam General Appearance:    Alert, cooperative, no distress, appears stated age  Head:    Normocephalic, without obvious abnormality, atraumatic  Eyes:    PERRL, conjunctiva/corneas clear, EOM's intact both eyes  Ears:    Normal TM's and external ear canals, both ears  Nose:   Nares normal, septum midline, mucosa normal, no drainage    or sinus tenderness  Throat:   Lips, mucosa, and tongue normal; teeth and gums normal  Neck:   Supple, symmetrical, trachea midline, no adenopathy;    Thyroid: no enlargement/tenderness/nodules  Back:     Symmetric, no curvature, ROM normal, no CVA tenderness  Lungs:     Clear to auscultation  bilaterally, respirations unlabored  Chest Wall:    No tenderness or deformity   Heart:    Regular rate and rhythm, S1 and S2 normal, no murmur, rub   or gallop  Breast Exam:    Deferred to GYN  Abdomen:     Soft, non-tender, bowel sounds active all four quadrants,    no masses, no organomegaly  Genitalia:    Deferred to GYN  Rectal:    Extremities:   Extremities normal, atraumatic, no cyanosis or edema  Pulses:   2+ and symmetric all extremities  Skin:   Skin color, texture, turgor normal, no rashes or lesions  Lymph nodes:   Cervical, supraclavicular, and axillary nodes normal  Neurologic:   CNII-XII intact, normal strength, sensation and reflexes    throughout          Assessment & Plan:

## 2023-07-26 NOTE — Assessment & Plan Note (Signed)
Pt's PE WNL w/ exception of BMI.  UTD on pap, too young for mammo.  Tdap updated.  Check labs.  Anticipatory guidance provided.

## 2023-07-26 NOTE — Assessment & Plan Note (Signed)
Check labs and replete prn. 

## 2023-07-26 NOTE — Assessment & Plan Note (Signed)
Check thyroglobulin to assess for any possible recurrence

## 2023-07-27 ENCOUNTER — Other Ambulatory Visit: Payer: Self-pay

## 2023-07-27 LAB — THYROGLOBULIN ANTIBODY: Thyroglobulin Ab: <1 [IU]/mL (ref <=1)

## 2023-07-27 MED ORDER — VITAMIN D (ERGOCALCIFEROL) 1.25 MG (50000 UNIT) PO CAPS: 50000.0000 [IU] | ORAL_CAPSULE | ORAL | 0 refills | Status: DC

## 2023-08-19 ENCOUNTER — Other Ambulatory Visit: Payer: Self-pay | Admitting: Family Medicine

## 2023-09-21 ENCOUNTER — Ambulatory Visit: Payer: BC Managed Care – PPO | Admitting: Family Medicine

## 2023-09-21 ENCOUNTER — Encounter: Payer: Self-pay | Admitting: Family Medicine

## 2023-09-21 VITALS — BP 110/76 | HR 90 | Temp 99.0°F | Wt 185.0 lb

## 2023-09-21 DIAGNOSIS — R059 Cough, unspecified: Secondary | ICD-10-CM

## 2023-09-21 DIAGNOSIS — J029 Acute pharyngitis, unspecified: Secondary | ICD-10-CM | POA: Diagnosis not present

## 2023-09-21 DIAGNOSIS — J020 Streptococcal pharyngitis: Secondary | ICD-10-CM | POA: Diagnosis not present

## 2023-09-21 LAB — POCT INFLUENZA A/B
Influenza A, POC: NEGATIVE
Influenza B, POC: NEGATIVE

## 2023-09-21 LAB — POC COVID19 BINAXNOW: SARS Coronavirus 2 Ag: NEGATIVE

## 2023-09-21 LAB — POCT RAPID STREP A (OFFICE): Rapid Strep A Screen: POSITIVE — AB

## 2023-09-21 MED ORDER — CEFUROXIME AXETIL 500 MG PO TABS: 500.0000 mg | ORAL_TABLET | Freq: Two times a day (BID) | ORAL | 0 refills | Status: AC

## 2023-09-21 MED ORDER — METHYLPREDNISOLONE 4 MG PO TBPK: ORAL_TABLET | ORAL | 0 refills | Status: DC

## 2023-09-21 MED ORDER — HYDROCODONE BIT-HOMATROP MBR 5-1.5 MG/5ML PO SOLN: 5.0000 mL | ORAL | 0 refills | Status: DC | PRN

## 2023-09-21 NOTE — Progress Notes (Signed)
   Subjective:    Patient ID: Natasha Fernandez, female    DOB: Aug 15, 1986, 37 y.o.   MRN: [id number]  HPI Here for 2 and 1/2 weeks of stuffy head, ST, a dry cough. She has had low grade fevers. She had a Tele Doc visit last week for this, and she was given Augmentin. This did not help.    Review of Systems  Constitutional:  Positive for fever.  HENT:  Positive for congestion, postnasal drip and sore throat. Negative for ear pain and sinus pressure.   Eyes: Negative.   Respiratory:  Positive for cough.        Objective:   Physical Exam Constitutional:      Appearance: Normal appearance. She is not ill-appearing.  HENT:     Right Ear: Tympanic membrane, ear canal and external ear normal.     Left Ear: Tympanic membrane, ear canal and external ear normal.     Nose: Nose normal.     Mouth/Throat:     Pharynx: Posterior oropharyngeal erythema present. No oropharyngeal exudate.  Eyes:     Conjunctiva/sclera: Conjunctivae normal.  Pulmonary:     Effort: Pulmonary effort is normal.     Breath sounds: Normal breath sounds.  Lymphadenopathy:     Cervical: No cervical adenopathy.  Neurological:     Mental Status: She is alert.           Assessment & Plan:  Partially treated strep pharyngitis. Treat with 10 days of Cefuroxime and a Medrol dose pack.  Gershon Crane, MD

## 2023-12-27 DIAGNOSIS — L578 Other skin changes due to chronic exposure to nonionizing radiation: Secondary | ICD-10-CM | POA: Diagnosis not present

## 2023-12-27 DIAGNOSIS — L814 Other melanin hyperpigmentation: Secondary | ICD-10-CM | POA: Diagnosis not present

## 2023-12-27 DIAGNOSIS — D229 Melanocytic nevi, unspecified: Secondary | ICD-10-CM | POA: Diagnosis not present

## 2023-12-27 DIAGNOSIS — D2271 Melanocytic nevi of right lower limb, including hip: Secondary | ICD-10-CM | POA: Diagnosis not present

## 2023-12-27 DIAGNOSIS — D485 Neoplasm of uncertain behavior of skin: Secondary | ICD-10-CM | POA: Diagnosis not present

## 2023-12-27 DIAGNOSIS — Z86018 Personal history of other benign neoplasm: Secondary | ICD-10-CM | POA: Diagnosis not present

## 2023-12-31 ENCOUNTER — Encounter (INDEPENDENT_AMBULATORY_CARE_PROVIDER_SITE_OTHER): Payer: Self-pay | Admitting: Family Medicine

## 2023-12-31 ENCOUNTER — Other Ambulatory Visit: Payer: Self-pay | Admitting: Family Medicine

## 2024-01-01 DIAGNOSIS — Z7184 Encounter for health counseling related to travel: Secondary | ICD-10-CM

## 2024-01-01 MED ORDER — ONDANSETRON HCL 4 MG PO TABS: 4.0000 mg | ORAL_TABLET | Freq: Three times a day (TID) | ORAL | 0 refills | Status: AC | PRN

## 2024-01-01 MED ORDER — SCOPOLAMINE 1 MG/3DAYS TD PT72: 1.0000 | MEDICATED_PATCH | TRANSDERMAL | 0 refills | Status: AC

## 2024-01-01 MED ORDER — CLONAZEPAM 0.5 MG PO TABS
0.5000 mg | ORAL_TABLET | Freq: Two times a day (BID) | ORAL | 0 refills | Status: DC | PRN
Start: 2024-01-01 — End: 2024-07-22

## 2024-01-01 NOTE — Telephone Encounter (Signed)
 [facility] VISIT   Patient agreed to [facility] visit and is aware that copayment and coinsurance may apply. Patient was treated using telemedicine according to accepted telemedicine protocols.  Subjective:   Patient complains of motion sickness  Patient Active Problem List   Diagnosis Date Noted   Obesity (BMI 30-39.9) 05/29/2019   Vitamin D deficiency 05/29/2019   Dysphonia 04/14/2017   Vocal cord paralysis 02/14/2017   History of thyroid cancer 02/08/2017   Laryngopharyngeal reflux (LPR) 01/20/2017   General medical examination 03/21/2011   Social History   Tobacco Use   Smoking status: Never   Smokeless tobacco: Never  Substance Use Topics   Alcohol use: Yes    Comment: rare    Current Outpatient Medications:    ondansetron (ZOFRAN) 4 MG tablet, Take 1 tablet (4 mg total) by mouth every 8 (eight) hours as needed., Disp: 30 tablet, Rfl: 0   scopolamine (TRANSDERM-SCOP) 1 MG/3DAYS, Place 1 patch (1.5 mg total) onto the skin every 3 (three) days., Disp: 10 patch, Rfl: 0   clonazePAM (KLONOPIN) 0.5 MG tablet, Take 1 tablet (0.5 mg total) by mouth 2 (two) times daily as needed for anxiety., Disp: 20 tablet, Rfl: 0   HYDROcodone bit-homatropine (HYCODAN) 5-1.5 MG/5ML syrup, Take 5 mLs by mouth every 4 (four) hours as needed., Disp: 240 mL, Rfl: 0   levothyroxine (SYNTHROID) 125 MCG tablet, TAKE 1 TABLET DAILY BEFORE BREAKFAST, Disp: 90 tablet, Rfl: 0   methylPREDNISolone (MEDROL DOSEPAK) 4 MG TBPK tablet, As directed, Disp: 21 tablet, Rfl: 0   valACYclovir (VALTREX) 1000 MG tablet, 2 tabs q12 x2 doses at first sign of cold sores, Disp: 32 tablet, Rfl: 1   Vitamin D, Ergocalciferol, (DRISDOL) 1.25 MG (50000 UNIT) CAPS capsule, Take 1 capsule (50,000 Units total) by mouth every 7 (seven) days., Disp: 12 capsule, Rfl: 0 No current facility-administered medications for this visit.  Facility-Administered Medications Ordered in Other Visits:    TDaP (BOOSTRIX) injection 0.5 mL, 0.5 mL,  Intramuscular, Once, Sheliah Hatch, MD  Allergies  Allergen Reactions   Haemophilus B Polysaccharide Vaccine Anaphylaxis   Influenza Vaccine Live Anaphylaxis   Other Anaphylaxis    Assessment and Plan:   Diagnosis: motion sickness. Please see myChart communication and orders below.   No orders of the defined types were placed in this encounter.  Meds ordered this encounter  Medications   ondansetron (ZOFRAN) 4 MG tablet    Sig: Take 1 tablet (4 mg total) by mouth every 8 (eight) hours as needed.    Dispense:  30 tablet    Refill:  0   scopolamine (TRANSDERM-SCOP) 1 MG/3DAYS    Sig: Place 1 patch (1.5 mg total) onto the skin every 3 (three) days.    Dispense:  10 patch    Refill:  0    Neena Rhymes, MD 01/01/2024  A total of 7 minutes were spent by me to personally review the patient-generated inquiry, review patient records and data pertinent to assessment of the patient's problem, develop a management plan including generation of prescriptions and/or orders, and on subsequent communication with the patient through secure the MyChart portal service.   There is no separately reported E/M service related to this service in the past 7 days nor does the patient have an upcoming soonest available appointment for this issue. This work was completed in less than 7 days.   The patient consented to this service today (see patient agreement prior to ongoing communication). Patient counseled regarding the need for  in-person exam for certain conditions and was advised to call the office if any changing or worsening symptoms occur.   The codes to be used for the E/M service are: [x]   99421 for 5-10 minutes of time spent on the inquiry. []   [id number] for 11-20 minutes. []   [id number] for 21+ minutes.

## 2024-01-01 NOTE — Telephone Encounter (Signed)
 Patient has not been seen by you since October but looking back per last note patient was instructed to follow up in a  year or as needed. Would she need to come in for an appointment for this?

## 2024-01-17 ENCOUNTER — Other Ambulatory Visit: Payer: Self-pay | Admitting: Family Medicine

## 2024-02-19 DIAGNOSIS — Z130 Encounter for screening for diseases of the blood and blood-forming organs and certain disorders involving the immune mechanism: Secondary | ICD-10-CM | POA: Diagnosis not present

## 2024-02-19 DIAGNOSIS — Z01419 Encounter for gynecological examination (general) (routine) without abnormal findings: Secondary | ICD-10-CM | POA: Diagnosis not present

## 2024-04-24 DIAGNOSIS — Z1231 Encounter for screening mammogram for malignant neoplasm of breast: Secondary | ICD-10-CM | POA: Diagnosis not present

## 2024-04-24 LAB — HM MAMMOGRAPHY

## 2024-07-22 ENCOUNTER — Ambulatory Visit (INDEPENDENT_AMBULATORY_CARE_PROVIDER_SITE_OTHER): Admitting: Family Medicine

## 2024-07-22 VITALS — BP 114/78 | HR 76 | Temp 98.0°F | Ht 65.0 in | Wt 198.1 lb

## 2024-07-22 DIAGNOSIS — E559 Vitamin D deficiency, unspecified: Secondary | ICD-10-CM

## 2024-07-22 DIAGNOSIS — Z0000 Encounter for general adult medical examination without abnormal findings: Secondary | ICD-10-CM | POA: Diagnosis not present

## 2024-07-22 DIAGNOSIS — L814 Other melanin hyperpigmentation: Secondary | ICD-10-CM | POA: Insufficient documentation

## 2024-07-22 DIAGNOSIS — E890 Postprocedural hypothyroidism: Secondary | ICD-10-CM

## 2024-07-22 DIAGNOSIS — D485 Neoplasm of uncertain behavior of skin: Secondary | ICD-10-CM | POA: Insufficient documentation

## 2024-07-22 DIAGNOSIS — D2370 Other benign neoplasm of skin of unspecified lower limb, including hip: Secondary | ICD-10-CM | POA: Insufficient documentation

## 2024-07-22 DIAGNOSIS — L578 Other skin changes due to chronic exposure to nonionizing radiation: Secondary | ICD-10-CM | POA: Insufficient documentation

## 2024-07-22 DIAGNOSIS — E669 Obesity, unspecified: Secondary | ICD-10-CM

## 2024-07-22 DIAGNOSIS — D225 Melanocytic nevi of trunk: Secondary | ICD-10-CM | POA: Insufficient documentation

## 2024-07-22 DIAGNOSIS — B079 Viral wart, unspecified: Secondary | ICD-10-CM | POA: Insufficient documentation

## 2024-07-22 DIAGNOSIS — Z808 Family history of malignant neoplasm of other organs or systems: Secondary | ICD-10-CM | POA: Insufficient documentation

## 2024-07-22 DIAGNOSIS — E039 Hypothyroidism, unspecified: Secondary | ICD-10-CM | POA: Insufficient documentation

## 2024-07-22 DIAGNOSIS — Z87898 Personal history of other specified conditions: Secondary | ICD-10-CM | POA: Insufficient documentation

## 2024-07-22 LAB — CBC WITH DIFFERENTIAL/PLATELET
Basophils Absolute: 0 K/uL (ref 0.0–0.1)
Basophils Relative: 0.4 % (ref 0.0–3.0)
Eosinophils Absolute: 0.1 K/uL (ref 0.0–0.7)
Eosinophils Relative: 1.5 % (ref 0.0–5.0)
HCT: 36.8 % (ref 36.0–46.0)
Hemoglobin: 12.1 g/dL (ref 12.0–15.0)
Lymphocytes Relative: 23.6 % (ref 12.0–46.0)
Lymphs Abs: 1.6 K/uL (ref 0.7–4.0)
MCHC: 32.8 g/dL (ref 30.0–36.0)
MCV: 86.7 fl (ref 78.0–100.0)
Monocytes Absolute: 0.7 K/uL (ref 0.1–1.0)
Monocytes Relative: 10.5 % (ref 3.0–12.0)
Neutro Abs: 4.3 K/uL (ref 1.4–7.7)
Neutrophils Relative %: 64 % (ref 43.0–77.0)
Platelets: 340 K/uL (ref 150.0–400.0)
RBC: 4.24 Mil/uL (ref 3.87–5.11)
RDW: 14.7 % (ref 11.5–15.5)
WBC: 6.6 K/uL (ref 4.0–10.5)

## 2024-07-22 LAB — HEPATIC FUNCTION PANEL
ALT: 14 U/L (ref 0–35)
AST: 13 U/L (ref 0–37)
Albumin: 4.2 g/dL (ref 3.5–5.2)
Alkaline Phosphatase: 59 U/L (ref 39–117)
Bilirubin, Direct: 0.1 mg/dL (ref 0.0–0.3)
Total Bilirubin: 0.3 mg/dL (ref 0.2–1.2)
Total Protein: 7.4 g/dL (ref 6.0–8.3)

## 2024-07-22 LAB — LIPID PANEL
Cholesterol: 169 mg/dL (ref 0–200)
HDL: 57 mg/dL (ref >39.00)
LDL Cholesterol: 98 mg/dL (ref 0–99)
NonHDL: 112.26
Total CHOL/HDL Ratio: 3
Triglycerides: 71 mg/dL (ref 0.0–149.0)
VLDL: 14.2 mg/dL (ref 0.0–40.0)

## 2024-07-22 LAB — BASIC METABOLIC PANEL WITH GFR
BUN: 13 mg/dL (ref 6–23)
CO2: 23 meq/L (ref 19–32)
Calcium: 9.3 mg/dL (ref 8.4–10.5)
Chloride: 107 meq/L (ref 96–112)
Creatinine, Ser: 0.68 mg/dL (ref 0.40–1.20)
GFR: 109.07 mL/min (ref >60.00)
Glucose, Bld: 91 mg/dL (ref 70–99)
Potassium: 3.9 meq/L (ref 3.5–5.1)
Sodium: 139 meq/L (ref 135–145)

## 2024-07-22 LAB — VITAMIN D 25 HYDROXY (VIT D DEFICIENCY, FRACTURES): VITD: 23.47 ng/mL — ABNORMAL LOW (ref 30.00–100.00)

## 2024-07-22 LAB — TSH: TSH: 0.38 u[IU]/mL (ref 0.35–5.50)

## 2024-07-22 MED ORDER — CLONAZEPAM 0.5 MG PO TABS: 0.5000 mg | ORAL_TABLET | Freq: Two times a day (BID) | ORAL | 3 refills | Status: AC | PRN

## 2024-07-22 MED ORDER — VALACYCLOVIR HCL 1 G PO TABS: ORAL_TABLET | ORAL | 1 refills | Status: AC

## 2024-07-22 MED ORDER — LEVOTHYROXINE SODIUM 125 MCG PO TABS: 125.0000 ug | ORAL_TABLET | Freq: Every day | ORAL | 0 refills | Status: DC

## 2024-07-22 NOTE — Assessment & Plan Note (Signed)
 Check labs and replete prn.

## 2024-07-22 NOTE — Addendum Note (Signed)
 Addended by: Giovanny Dugal E on: 07/22/2024 08:30 AM   Modules accepted: Level of Service

## 2024-07-22 NOTE — Assessment & Plan Note (Signed)
 Pt's PE WNL w/ exception of BMI.  UTD on pap, mammo, Tdap.  Declines flu.  Check labs.  Anticipatory guidance provided.

## 2024-07-22 NOTE — Patient Instructions (Signed)
Schedule your complete physical in 6 months We'll notify you of your lab results and make any changes if needed Continue to work on healthy diet and regular exercise- you can do it! Call with any questions or concerns Stay Safe!  Stay Healthy! Happy Fall!!! 

## 2024-07-22 NOTE — Assessment & Plan Note (Signed)
 Deteriorated.  Pt has gained 13 lbs since December 2024.  BMI 32.97.  Was previously exercising regularly but has recently stopped exercising.  Reports this is very much on her radar and she plans to resume.  Check labs to risk stratify. Will follow.

## 2024-07-22 NOTE — Assessment & Plan Note (Signed)
Chronic problem.  Currently asymptomatic on Levothyroxine daily.  Check labs.  Adjust meds prn

## 2024-07-22 NOTE — Progress Notes (Addendum)
   Subjective:    Patient ID: Duwaine MALVA Lovings, female    DOB: 11-28-1985, 38 y.o.   MRN: [id number]  HPI CPE- UTD on mammo (requesting records), pap, Tdap.  Declines flu  Patient Care Team    Relationship Specialty Notifications Start End  Mahlon Comer BRAVO, MD PCP - General Family Medicine  04/06/11   Estelle Service, MD Consulting Physician Obstetrics and Gynecology  05/29/19   [facility]    07/26/23     Health Maintenance  Topic Date Due   Hepatitis B Vaccines 19-59 Average Risk (1 of 3 - 19+ 3-dose series) Never done   HPV VACCINES (1 - Risk 3-dose SCDM series) Never done   Mammogram  04/21/2024   Cervical Cancer Screening (HPV/Pap Cotest)  07/14/2026   DTaP/Tdap/Td (3 - Td or Tdap) 07/25/2033   Hepatitis C Screening  Completed   HIV Screening  Completed   Pneumococcal Vaccine  Aged Out   Meningococcal B Vaccine  Aged Out   COVID-19 Vaccine  Discontinued    Hypothyroid- chronic problem, on Levothyroxine  125mcg daily.  Pt reports feeling well.  No changes to skin/hair/nails.  Energy level is stable.  Obesity- pt is up 13 lbs since Dec.  BMI now 32.97  Pt was previously doing the treadmill regularly but then life got in the way.  Has regained the weight she lost but is very aware and plans to resume exercise.  No CP, SOB.   Review of Systems For ROS see HPI     Objective:   Physical Exam Vitals reviewed.  Constitutional:      General: She is not in acute distress.    Appearance: Normal appearance. She is well-developed. She is not ill-appearing.  HENT:     Head: Normocephalic and atraumatic.  Eyes:     Conjunctiva/sclera: Conjunctivae normal.     Pupils: Pupils are equal, round, and reactive to light.  Neck:     Thyroid : No thyromegaly.  Cardiovascular:     Rate and Rhythm: Normal rate and regular rhythm.     Pulses: Normal pulses.     Heart sounds: Normal heart sounds. No murmur heard. Pulmonary:     Effort: Pulmonary effort is normal. No respiratory distress.      Breath sounds: Normal breath sounds.  Abdominal:     General: There is no distension.     Palpations: Abdomen is soft.     Tenderness: There is no abdominal tenderness.  Musculoskeletal:     Cervical back: Normal range of motion and neck supple.     Right lower leg: No edema.     Left lower leg: No edema.  Lymphadenopathy:     Cervical: No cervical adenopathy.  Skin:    General: Skin is warm and dry.  Neurological:     General: No focal deficit present.     Mental Status: She is alert and oriented to person, place, and time.  Psychiatric:        Mood and Affect: Mood normal.        Behavior: Behavior normal.        Thought Content: Thought content normal.           Assessment & Plan:

## 2024-07-23 ENCOUNTER — Other Ambulatory Visit: Payer: Self-pay

## 2024-07-23 ENCOUNTER — Ambulatory Visit: Payer: Self-pay | Admitting: Family Medicine

## 2024-07-23 MED ORDER — VITAMIN D (ERGOCALCIFEROL) 1.25 MG (50000 UNIT) PO CAPS: 50000.0000 [IU] | ORAL_CAPSULE | ORAL | 0 refills | Status: AC

## 2024-07-25 ENCOUNTER — Ambulatory Visit: Payer: BC Managed Care – PPO | Admitting: Family Medicine

## 2024-10-04 DIAGNOSIS — R070 Pain in throat: Secondary | ICD-10-CM | POA: Diagnosis not present

## 2024-10-04 DIAGNOSIS — M791 Myalgia, unspecified site: Secondary | ICD-10-CM | POA: Diagnosis not present

## 2024-10-04 DIAGNOSIS — R509 Fever, unspecified: Secondary | ICD-10-CM | POA: Diagnosis not present

## 2024-11-14 ENCOUNTER — Other Ambulatory Visit: Payer: Self-pay | Admitting: Family Medicine

## 2025-07-23 ENCOUNTER — Encounter: Admitting: Family Medicine
